# Patient Record
Sex: Male | Born: 1960 | Race: White | Hispanic: No | Smoking: Current every day smoker
Health system: Southern US, Community
[De-identification: ages and names within clinical notes are randomized; demographics above are authoritative.]

## PROBLEM LIST (undated history)

## (undated) DIAGNOSIS — Z720 Tobacco use: Secondary | ICD-10-CM

## (undated) DIAGNOSIS — E785 Hyperlipidemia, unspecified: Secondary | ICD-10-CM

## (undated) DIAGNOSIS — R972 Elevated prostate specific antigen [PSA]: Secondary | ICD-10-CM

## (undated) DIAGNOSIS — I10 Essential (primary) hypertension: Secondary | ICD-10-CM

## (undated) HISTORY — PX: MANDIBLE FRACTURE SURGERY: SHX706

## (undated) HISTORY — DX: Elevated prostate specific antigen (PSA): R97.20

## (undated) HISTORY — DX: Hyperlipidemia, unspecified: E78.5

## (undated) HISTORY — DX: Essential (primary) hypertension: I10

## (undated) HISTORY — DX: Tobacco use: Z72.0

---

## 2013-01-21 ENCOUNTER — Telehealth: Payer: Self-pay

## 2013-01-21 NOTE — Telephone Encounter (Signed)
Pt referred by Dr. Sherwood Gambler for screening colonscopy. LM to call.

## 2013-01-27 NOTE — Telephone Encounter (Signed)
LM for pt to call

## 2013-01-29 NOTE — Telephone Encounter (Signed)
Letter to pt and PCP.  

## 2013-02-23 ENCOUNTER — Encounter (INDEPENDENT_AMBULATORY_CARE_PROVIDER_SITE_OTHER): Payer: Self-pay | Admitting: *Deleted

## 2015-03-15 ENCOUNTER — Other Ambulatory Visit (HOSPITAL_COMMUNITY): Payer: Self-pay | Admitting: Family Medicine

## 2015-03-15 ENCOUNTER — Ambulatory Visit (HOSPITAL_COMMUNITY)
Admission: RE | Admit: 2015-03-15 | Discharge: 2015-03-15 | Disposition: A | Discharge status: Home / Self Care | Payer: 59 | Source: Ambulatory Visit | Attending: Family Medicine | Admitting: Family Medicine

## 2015-03-15 DIAGNOSIS — M546 Pain in thoracic spine: Secondary | ICD-10-CM

## 2015-03-15 DIAGNOSIS — J984 Other disorders of lung: Secondary | ICD-10-CM

## 2015-03-15 DIAGNOSIS — Z720 Tobacco use: Secondary | ICD-10-CM | POA: Diagnosis not present

## 2015-03-15 MED ORDER — IOHEXOL 300 MG/ML  SOLN
80.0000 mL | Freq: Once | INTRAMUSCULAR | Status: AC | PRN
  Administered 2015-03-15: 80 mL via INTRAVENOUS

## 2016-02-15 ENCOUNTER — Encounter (INDEPENDENT_AMBULATORY_CARE_PROVIDER_SITE_OTHER): Payer: Self-pay | Admitting: *Deleted

## 2017-02-19 DIAGNOSIS — Z6821 Body mass index (BMI) 21.0-21.9, adult: Secondary | ICD-10-CM | POA: Diagnosis not present

## 2017-02-19 DIAGNOSIS — M25562 Pain in left knee: Secondary | ICD-10-CM | POA: Diagnosis not present

## 2017-02-19 DIAGNOSIS — M1991 Primary osteoarthritis, unspecified site: Secondary | ICD-10-CM | POA: Diagnosis not present

## 2017-02-26 ENCOUNTER — Encounter (INDEPENDENT_AMBULATORY_CARE_PROVIDER_SITE_OTHER): Payer: Self-pay | Admitting: *Deleted

## 2017-05-08 DIAGNOSIS — Z1389 Encounter for screening for other disorder: Secondary | ICD-10-CM | POA: Diagnosis not present

## 2017-05-08 DIAGNOSIS — R42 Dizziness and giddiness: Secondary | ICD-10-CM | POA: Diagnosis not present

## 2017-05-08 DIAGNOSIS — M1991 Primary osteoarthritis, unspecified site: Secondary | ICD-10-CM | POA: Diagnosis not present

## 2017-05-08 DIAGNOSIS — Z6820 Body mass index (BMI) 20.0-20.9, adult: Secondary | ICD-10-CM | POA: Diagnosis not present

## 2017-05-21 DIAGNOSIS — Z1211 Encounter for screening for malignant neoplasm of colon: Secondary | ICD-10-CM | POA: Diagnosis not present

## 2017-07-26 DIAGNOSIS — M1991 Primary osteoarthritis, unspecified site: Secondary | ICD-10-CM | POA: Diagnosis not present

## 2017-07-26 DIAGNOSIS — G894 Chronic pain syndrome: Secondary | ICD-10-CM | POA: Diagnosis not present

## 2017-07-26 DIAGNOSIS — Z6821 Body mass index (BMI) 21.0-21.9, adult: Secondary | ICD-10-CM | POA: Diagnosis not present

## 2019-01-29 DIAGNOSIS — Z23 Encounter for immunization: Secondary | ICD-10-CM | POA: Diagnosis not present

## 2019-10-08 ENCOUNTER — Other Ambulatory Visit: Payer: Self-pay

## 2019-10-08 DIAGNOSIS — Z20822 Contact with and (suspected) exposure to covid-19: Secondary | ICD-10-CM

## 2019-10-09 LAB — NOVEL CORONAVIRUS, NAA: SARS-CoV-2, NAA: NOT DETECTED

## 2021-02-23 ENCOUNTER — Telehealth: Payer: Self-pay

## 2021-02-23 NOTE — Telephone Encounter (Signed)
Mr Spadafore called inquiring about [facility] and [facility]. Discussed documents needed to enroll with [facility]. Appointment to apply in office for 02/28/21 at 1PM Directions given and documents reviewed to bring.   Francee Nodal RN [employer]

## 2021-03-14 ENCOUNTER — Telehealth: Payer: Self-pay

## 2021-03-14 NOTE — Telephone Encounter (Signed)
Attempted to return call unable to leave voicemail. Client wanting to reschedule an appointment to enroll in Care [facility].   Francee Nodal RN [employer]

## 2021-03-27 NOTE — Congregational Nurse Program (Signed)
Pt attended [facility]  to be connected with the [facility] program to get established with a primary care provider  Pt states he last seen a PCP Feb 2021 at [facility].  States her chief complaint(s) Pt states he stepped in an uneven area in his yard about 3 months ago and since then noticed right knee pain.  States he has noticed cracking sounds in his knees.  To help with pain has been utilizing knee braces, pain patches, and OTC pain medicines and creams.   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  Also in mid-90"s surgery related to broken rib and punctured lung.  Pt does admit to being a current smoker and smoking less than 1/2 pk of cigarettes a a day, a denies current drug and alcohol use.  One (1) Socio-determinants health needs identified: -need for more food access (resource information for food mobile market on 4.29.22    Appointment for first medical visit was scheduled for Tues, Apr 04 2021 @ 10:15 am at the [facility] [place].    Plan -  Enrollment and eligibility was completed on today's for the [facility] Connect Program by Abran Duke.    -Referred to Nurse Case Manager, Norval Gable, for initial and continous medical case management upon completion of first medical appointment at [facility]  -Refer to [facility] Questionairre for other referrals made

## 2021-03-28 ENCOUNTER — Ambulatory Visit: Payer: Self-pay | Admitting: Physician Assistant

## 2021-04-04 ENCOUNTER — Other Ambulatory Visit: Payer: Self-pay

## 2021-04-04 ENCOUNTER — Telehealth: Payer: Self-pay

## 2021-04-04 ENCOUNTER — Other Ambulatory Visit (HOSPITAL_COMMUNITY)
Admission: RE | Admit: 2021-04-04 | Discharge: 2021-04-04 | Disposition: A | Discharge status: Home / Self Care | Payer: Self-pay | Source: Ambulatory Visit | Attending: Physician Assistant | Admitting: Physician Assistant

## 2021-04-04 ENCOUNTER — Other Ambulatory Visit: Payer: Self-pay | Admitting: Physician Assistant

## 2021-04-04 ENCOUNTER — Encounter: Payer: Self-pay | Admitting: Physician Assistant

## 2021-04-04 ENCOUNTER — Ambulatory Visit: Payer: Self-pay | Admitting: Physician Assistant

## 2021-04-04 ENCOUNTER — Ambulatory Visit (HOSPITAL_COMMUNITY)
Admission: RE | Admit: 2021-04-04 | Discharge: 2021-04-04 | Disposition: A | Discharge status: Home / Self Care | Payer: Self-pay | Source: Ambulatory Visit | Attending: Physician Assistant | Admitting: Physician Assistant

## 2021-04-04 VITALS — BP 145/97 | HR 96 | Temp 97.2°F | Ht 71.5 in | Wt 167.0 lb

## 2021-04-04 DIAGNOSIS — Z125 Encounter for screening for malignant neoplasm of prostate: Secondary | ICD-10-CM | POA: Insufficient documentation

## 2021-04-04 DIAGNOSIS — Z13220 Encounter for screening for lipoid disorders: Secondary | ICD-10-CM | POA: Insufficient documentation

## 2021-04-04 DIAGNOSIS — Z7689 Persons encountering health services in other specified circumstances: Secondary | ICD-10-CM

## 2021-04-04 DIAGNOSIS — M25561 Pain in right knee: Secondary | ICD-10-CM | POA: Insufficient documentation

## 2021-04-04 DIAGNOSIS — F17200 Nicotine dependence, unspecified, uncomplicated: Secondary | ICD-10-CM

## 2021-04-04 DIAGNOSIS — Z1211 Encounter for screening for malignant neoplasm of colon: Secondary | ICD-10-CM

## 2021-04-04 DIAGNOSIS — R030 Elevated blood-pressure reading, without diagnosis of hypertension: Secondary | ICD-10-CM

## 2021-04-04 DIAGNOSIS — M25461 Effusion, right knee: Secondary | ICD-10-CM

## 2021-04-04 LAB — COMPREHENSIVE METABOLIC PANEL
ALT: 12 U/L (ref 0–44)
AST: 19 U/L (ref 15–41)
Albumin: 4.1 g/dL (ref 3.5–5.0)
Alkaline Phosphatase: 70 U/L (ref 38–126)
Anion gap: 9 (ref 5–15)
BUN: 10 mg/dL (ref 8–23)
CO2: 21 mmol/L — ABNORMAL LOW (ref 22–32)
Calcium: 9.2 mg/dL (ref 8.9–10.3)
Chloride: 104 mmol/L (ref 98–111)
Creatinine, Ser: 0.75 mg/dL (ref 0.61–1.24)
GFR, Estimated: >60 mL/min (ref >60)
Glucose, Bld: 103 mg/dL — ABNORMAL HIGH (ref 70–99)
Potassium: 4.1 mmol/L (ref 3.5–5.1)
Sodium: 134 mmol/L — ABNORMAL LOW (ref 135–145)
Total Bilirubin: 0.7 mg/dL (ref 0.3–1.2)
Total Protein: 8.4 g/dL — ABNORMAL HIGH (ref 6.5–8.1)

## 2021-04-04 LAB — LIPID PANEL
Cholesterol: 178 mg/dL (ref 0–200)
HDL: 29 mg/dL — ABNORMAL LOW (ref >40)
LDL Cholesterol: 121 mg/dL — ABNORMAL HIGH (ref 0–99)
Total CHOL/HDL Ratio: 6.1 RATIO
Triglycerides: 142 mg/dL (ref <150)
VLDL: 28 mg/dL (ref 0–40)

## 2021-04-04 LAB — PSA: Prostatic Specific Antigen: 4.55 ng/mL — ABNORMAL HIGH (ref 0.00–4.00)

## 2021-04-04 NOTE — Telephone Encounter (Signed)
Called to follow up with client after his first [facility] appointment. He states everything went very well and he has gone to get his labs drawn and x-rays. He states he is being referred to orthopedist and will need CAFA assistance. Emailed D. Leavy Cella of [facility] for financial assistance application assistance.  No further needs at this time.  Francee Nodal RN  [employer]

## 2021-04-04 NOTE — Progress Notes (Signed)
BP (!) 145/97   Pulse 96   Temp (!) 97.2 F (36.2 C)   Ht 5' 11.5" (1.816 m)   Wt 167 lb (75.8 kg)   SpO2 99%   BMI 22.97 kg/m    Subjective:    Patient ID: Rodney Hampton, male    DOB: 05-Aug-1961, 60 y.o.   MRN: [id number]  HPI: Rodney Hampton is a 60 y.o. male presenting on 04/04/2021 for New Patient (Initial Visit)   HPI   Pt had a negative covid 19 screening questionnaire.    Pt is 60yoM who presents to establish care  Pt previously went to [facility].  Pt has not worked in about a year.  He previously worked in Scient[employer], water quality.  He has Never had colonoscopy; he "did the cards"  Pt has has some problems with his right knee in the past.  He was told he had arthritis.  About a month ago, he stepped in a hole and his knee has been much more painful.  Pt has not gotten the covid vaccination.     Relevant past medical, surgical, family and social history reviewed and updated as indicated. Interim medical history since our last visit reviewed. Allergies and medications reviewed and updated.  No current outpatient medications on file.     Review of Systems  Per HPI unless specifically indicated above     Objective:    BP (!) 145/97   Pulse 96   Temp (!) 97.2 F (36.2 C)   Ht 5' 11.5" (1.816 m)   Wt 167 lb (75.8 kg)   SpO2 99%   BMI 22.97 kg/m   Wt Readings from Last 3 Encounters:  04/04/21 167 lb (75.8 kg)    Physical Exam Constitutional:      General: He is not in acute distress.    Appearance: He is not toxic-appearing.     Comments: disheveled  HENT:     Head: Normocephalic and atraumatic.     Right Ear: Tympanic membrane and ear canal normal.     Left Ear: Tympanic membrane and ear canal normal.  Eyes:     Extraocular Movements: Extraocular movements intact.     Conjunctiva/sclera: Conjunctivae normal.     Pupils: Pupils are equal, round, and reactive to light.  Cardiovascular:     Rate and Rhythm: Normal rate and regular rhythm.      Pulses: Normal pulses.     Heart sounds: Normal heart sounds.  Pulmonary:     Effort: Pulmonary effort is normal. No respiratory distress.     Breath sounds: Normal breath sounds. No wheezing.  Abdominal:     General: Bowel sounds are normal.     Palpations: Abdomen is soft. There is no mass or pulsatile mass.     Tenderness: There is no abdominal tenderness.  Musculoskeletal:     Cervical back: Neck supple.     Right knee: Swelling and crepitus present. No erythema or ecchymosis. Decreased range of motion. Tenderness present.     Right lower leg: No edema.     Left lower leg: No edema.  Lymphadenopathy:     Cervical: No cervical adenopathy.  Skin:    General: Skin is warm and dry.     Comments: Fingernails long and yellowed from smoking  Neurological:     Mental Status: He is alert and oriented to person, place, and time.     Motor: No tremor.     Gait: Gait abnormal.  Psychiatric:  Attention and Perception: Attention normal.        Speech: Speech normal.        Behavior: Behavior normal. Behavior is cooperative.              Assessment & Plan:    Encounter Diagnoses  Name Primary?  . Encounter to establish care Yes  . Acute pain of right knee   . Pain and swelling of right knee   . Elevated blood pressure reading   . Screening for prostate cancer   . Screening cholesterol level   . Screening for colon cancer   . Tobacco use disorder       -will get Baseline labs -will get Xray knee -pt is encouraged to get covid vaccination -pt is given cafa / application for [facility] [employer] financial assistance -pt is given FIT test for colon cancer screening  -will Refer to orthopedics for the knee -will recheck bp at next appointment.  He has no history of htn -pt to follow up 1 month.  Will review labs, recheck bp and discuss knee.  He is to contact office sooner prn

## 2021-04-13 ENCOUNTER — Telehealth: Payer: Self-pay

## 2021-04-13 NOTE — Telephone Encounter (Signed)
Client called to follow up with assistance with CAFA application. He has had labs drawn and an x-ray at [facility]. Emailed D. [facility] program manager to follow up with financial assistance application. Client's provider per Epic notes will refer client to orthopedics and client confirms.  Food: reminded client of the [employer]. He forgot the most recent. Will attempt to send a reminder text next month the day prior to the market. Client reports he currently has food.  Encouraged client to contact [facility] Connect for any resources or needs. Client reports understanding.  Francee Nodal RN [employer]

## 2021-05-09 ENCOUNTER — Ambulatory Visit: Payer: Self-pay | Admitting: Physician Assistant

## 2021-05-09 ENCOUNTER — Encounter: Payer: Self-pay | Admitting: Physician Assistant

## 2021-05-09 VITALS — BP 142/94 | HR 96 | Temp 95.0°F

## 2021-05-09 DIAGNOSIS — F17200 Nicotine dependence, unspecified, uncomplicated: Secondary | ICD-10-CM

## 2021-05-09 DIAGNOSIS — E785 Hyperlipidemia, unspecified: Secondary | ICD-10-CM

## 2021-05-09 DIAGNOSIS — I10 Essential (primary) hypertension: Secondary | ICD-10-CM

## 2021-05-09 DIAGNOSIS — R972 Elevated prostate specific antigen [PSA]: Secondary | ICD-10-CM

## 2021-05-09 DIAGNOSIS — M25561 Pain in right knee: Secondary | ICD-10-CM

## 2021-05-09 DIAGNOSIS — M1711 Unilateral primary osteoarthritis, right knee: Secondary | ICD-10-CM

## 2021-05-09 LAB — IFOBT (OCCULT BLOOD): IFOBT: NEGATIVE

## 2021-05-09 MED ORDER — LISINOPRIL 10 MG PO TABS: 10.0000 mg | ORAL_TABLET | Freq: Every day | ORAL | 3 refills | Status: DC

## 2021-05-09 NOTE — Patient Instructions (Signed)
High Cholesterol  High cholesterol is a condition in which the blood has high levels of a white, waxy substance similar to fat (cholesterol). The liver makes all the cholesterol that the body needs. The human body needs small amounts of cholesterol to help build cells. A person gets extra or excess cholesterol from the food that he or she eats. The blood carries cholesterol from the liver to the rest of the body. If you have high cholesterol, deposits (plaques) may build up on the walls of your arteries. Arteries are the blood vessels that carry blood away from your heart. These plaques make the arteries narrow and stiff. Cholesterol plaques increase your risk for heart attack and stroke. Work with your health care provider to keep your cholesterol levels in a healthy range. What increases the risk? The following factors may make you more likely to develop this condition:  Eating foods that are high in animal fat (saturated fat) or cholesterol.  Being overweight.  Not getting enough exercise.  A family history of high cholesterol (familial hypercholesterolemia).  Use of tobacco products.  Having diabetes. What are the signs or symptoms? There are no symptoms of this condition. How is this diagnosed? This condition may be diagnosed based on the results of a blood test.  If you are older than 62 years of age, your health care provider may check your cholesterol levels every 4-6 years.  You may be checked more often if you have high cholesterol or other risk factors for heart disease. The blood test for cholesterol measures:  "Bad" cholesterol, or LDL cholesterol. This is the main type of cholesterol that causes heart disease. The desired level is less than 100 mg/dL.  "Good" cholesterol, or HDL cholesterol. HDL helps protect against heart disease by cleaning the arteries and carrying the LDL to the liver for processing. The desired level for HDL is 60 mg/dL or higher.  Triglycerides.  These are fats that your body can store or burn for energy. The desired level is less than 150 mg/dL.  Total cholesterol. This measures the total amount of cholesterol in your blood and includes LDL, HDL, and triglycerides. The desired level is less than 200 mg/dL. How is this treated? This condition may be treated with:  Diet changes. You may be asked to eat foods that have more fiber and less saturated fats or added sugar.  Lifestyle changes. These may include regular exercise, maintaining a healthy weight, and quitting use of tobacco products.  Medicines. These are given when diet and lifestyle changes have not worked. You may be prescribed a statin medicine to help lower your cholesterol levels. Follow these instructions at home: Eating and drinking  Eat a healthy, balanced diet. This diet includes: ? Daily servings of a variety of fresh, frozen, or canned fruits and vegetables. ? Daily servings of whole grain foods that are rich in fiber. ? Foods that are low in saturated fats and trans fats. These include poultry and fish without skin, lean cuts of meat, and low-fat dairy products. ? A variety of fish, especially oily fish that contain omega-3 fatty acids. Aim to eat fish at least 2 times a week.  Avoid foods and drinks that have added sugar.  Use healthy cooking methods, such as roasting, grilling, broiling, baking, poaching, steaming, and stir-frying. Do not fry your food except for stir-frying.   Lifestyle  Get regular exercise. Aim to exercise for a total of 150 minutes a week. Increase your activity level by doing activities   such as gardening, walking, and taking the stairs.  Do not use any products that contain nicotine or tobacco, such as cigarettes, e-cigarettes, and chewing tobacco. If you need help quitting, ask your health care provider.   General instructions  Take over-the-counter and prescription medicines only as told by your health care provider.  Keep all  follow-up visits as told by your health care provider. This is important. Where to find more information  American Heart Association: www.heart.org  National Heart, Lung, and Blood Institute: www.nhlbi.nih.gov Contact a health care provider if:  You have trouble achieving or maintaining a healthy diet or weight.  You are starting an exercise program.  You are unable to stop smoking. Get help right away if:  You have chest pain.  You have trouble breathing.  You have any symptoms of a stroke. "BE FAST" is an easy way to remember the main warning signs of a stroke: ? B - Balance. Signs are dizziness, sudden trouble walking, or loss of balance. ? E - Eyes. Signs are trouble seeing or a sudden change in vision. ? F - Face. Signs are sudden weakness or numbness of the face, or the face or eyelid drooping on one side. ? A - Arms. Signs are weakness or numbness in an arm. This happens suddenly and usually on one side of the body. ? S - Speech. Signs are sudden trouble speaking, slurred speech, or trouble understanding what people say. ? T - Time. Time to call emergency services. Write down what time symptoms started.  You have other signs of a stroke, such as: ? A sudden, severe headache with no known cause. ? Nausea or vomiting. ? Seizure. These symptoms may represent a serious problem that is an emergency. Do not wait to see if the symptoms will go away. Get medical help right away. Call your local emergency services (911 in the [place] Do not drive yourself to the hospital. Summary  Cholesterol plaques increase your risk for heart attack and stroke. Work with your health care provider to keep your cholesterol levels in a healthy range.  Eat a healthy, balanced diet, get regular exercise, and maintain a healthy weight.  Do not use any products that contain nicotine or tobacco, such as cigarettes, e-cigarettes, and chewing tobacco.  Get help right away if you have any symptoms of a  stroke. This information is not intended to replace advice given to you by your health care provider. Make sure you discuss any questions you have with your health care provider. Document Revised: 10/19/2019 Document Reviewed: 10/19/2019 Elsevier Patient Education  2021 Elsevier Inc.  

## 2021-05-09 NOTE — Progress Notes (Signed)
BP (!) 142/94   Pulse 96   Temp (!) 95 F (35 C)   SpO2 97%    Subjective:    Patient ID: Rodney Hampton, male    DOB: 1961-04-17, 60 y.o.   MRN: [id number]  HPI: Rodney Hampton is a 60 y.o. male presenting on 05/09/2021 for Hypertension   HPI   Pt had a negative covid 19 screening questionnaire.   Chief Complaint  Patient presents with  . Hypertension     Relevant past medical, surgical, family and social history reviewed and updated as indicated. Interim medical history since our last visit reviewed. Allergies and medications reviewed and updated.  CURRENT MEDS: OTC ibuprofen prn   Review of Systems  Per HPI unless specifically indicated above     Objective:    BP (!) 142/94   Pulse 96   Temp (!) 95 F (35 C)   SpO2 97%   Wt Readings from Last 3 Encounters:  04/04/21 167 lb (75.8 kg)    Physical Exam Vitals reviewed.  Constitutional:      General: He is not in acute distress.    Appearance: He is well-developed. He is not toxic-appearing.     Comments: disheveled  HENT:     Head: Normocephalic and atraumatic.  Cardiovascular:     Rate and Rhythm: Normal rate and regular rhythm.  Pulmonary:     Effort: Pulmonary effort is normal.     Breath sounds: Normal breath sounds. No wheezing.  Abdominal:     General: Bowel sounds are normal.     Palpations: Abdomen is soft.     Tenderness: There is no abdominal tenderness.  Musculoskeletal:     Cervical back: Neck supple.     Right knee: Swelling and crepitus present. Decreased range of motion. Tenderness present.  Lymphadenopathy:     Cervical: No cervical adenopathy.  Skin:    General: Skin is warm and dry.  Neurological:     Mental Status: He is alert and oriented to person, place, and time.     Gait: Gait abnormal.  Psychiatric:        Attention and Perception: Attention normal.        Behavior: Behavior normal. Behavior is cooperative.     Comments: Very pleasant.  Engages easily.       Results for orders placed or performed during the hospital encounter of 04/04/21  Lipid panel  Result Value Ref Range   Cholesterol 178 0 - 200 mg/dL   Triglycerides 621 <308 mg/dL   HDL 29 (L) >65 mg/dL   Total CHOL/HDL Ratio 6.1 RATIO   VLDL 28 0 - 40 mg/dL   LDL Cholesterol 784 (H) 0 - 99 mg/dL  Comprehensive metabolic panel  Result Value Ref Range   Sodium 134 (L) 135 - 145 mmol/L   Potassium 4.1 3.5 - 5.1 mmol/L   Chloride 104 98 - 111 mmol/L   CO2 21 (L) 22 - 32 mmol/L   Glucose, Bld 103 (H) 70 - 99 mg/dL   BUN 10 8 - 23 mg/dL   Creatinine, Ser 6.96 0.61 - 1.24 mg/dL   Calcium 9.2 8.9 - 29.5 mg/dL   Total Protein 8.4 (H) 6.5 - 8.1 g/dL   Albumin 4.1 3.5 - 5.0 g/dL   AST 19 15 - 41 U/L   ALT 12 0 - 44 U/L   Alkaline Phosphatase 70 38 - 126 U/L   Total Bilirubin 0.7 0.3 - 1.2 mg/dL   GFR, Estimated >28 >  60 mL/min   Anion gap 9 5 - 15  PSA  Result Value Ref Range   Prostatic Specific Antigen 4.55 (H) 0.00 - 4.00 ng/mL      Assessment & Plan:    Encounter Diagnoses  Name Primary?  . Primary hypertension Yes  . Hyperlipidemia, unspecified hyperlipidemia type   . Elevated PSA   . Acute pain of right knee   . Osteoarthritis of right knee, unspecified osteoarthritis type   . Tobacco use disorder      -reviewed labs with pt including FIT test which was normal and knee xray -pt was given cafa/application for [employer] financial assistance.  He thinks he applied.  Will check with [facility] to see if pt is approved. -Refer to orthopedics for R knee -will start lisinopril for htn -counseled on cholesterol.  Discussed lowfat diet and regular exercise.  Discussed that if it is still high in 3 months at recheck will add statin -Refer to urologist for elevated psa -pt was Encouraged to get covid vaccination -encouraged smoking cessation -pt to follow up 3 months.  He is to contact office sooner prn

## 2021-05-10 ENCOUNTER — Encounter: Payer: Self-pay | Admitting: Orthopaedic Surgery

## 2021-05-23 ENCOUNTER — Ambulatory Visit (INDEPENDENT_AMBULATORY_CARE_PROVIDER_SITE_OTHER): Payer: Self-pay | Admitting: Orthopaedic Surgery

## 2021-05-23 ENCOUNTER — Telehealth: Payer: Self-pay

## 2021-05-23 ENCOUNTER — Other Ambulatory Visit: Payer: Self-pay

## 2021-05-23 ENCOUNTER — Encounter: Payer: Self-pay | Admitting: Orthopaedic Surgery

## 2021-05-23 VITALS — BP 155/98 | HR 101 | Ht 73.0 in | Wt 170.0 lb

## 2021-05-23 DIAGNOSIS — M25461 Effusion, right knee: Secondary | ICD-10-CM

## 2021-05-23 DIAGNOSIS — M1711 Unilateral primary osteoarthritis, right knee: Secondary | ICD-10-CM

## 2021-05-23 DIAGNOSIS — G8929 Other chronic pain: Secondary | ICD-10-CM

## 2021-05-23 NOTE — Telephone Encounter (Signed)
Client called after leaving orthopedist inquiring about his CAFA application which was submitted for review on 05/18/21 by Fay Records of [facility]. Client reports specialist would like to order MRI for client however needs to know CAFA approval. Will plan to follow up with D. Leavy Cella when she is available. Client reports understanding.  Francee Nodal RN [facility] [employer]/[facility]

## 2021-05-23 NOTE — Progress Notes (Signed)
Subjective:    Patient ID: Rodney Hampton, male    DOB: 30-Aug-1961, 60 y.o.   MRN: [id number]  HPI He has a long five to six year history of pain of the right knee getting worse over the last several months.  He has large effusion, popping and giving way.  He has been seen by his primary care doctor and referred here.  Prior X-rays done showed large effusion and bone on bone.    I have independently reviewed and interpreted x-rays of this patient done at another site by another physician or qualified health professional.  He is not responding to NSAIDs, rubs, ice or heat.  He has no trauma.  He has no redness.  He is not getting any better.   Review of Systems  Constitutional:  Positive for activity change.  Musculoskeletal:  Positive for arthralgias, gait problem, joint swelling and myalgias.  All other systems reviewed and are negative. For Review of Systems, all other systems reviewed and are negative.  The following is a summary of the past history medically, past history surgically, known current medicines, social history and family history.  This information is gathered electronically by the computer from prior information and documentation.  I review this each visit and have found including this information at this point in the chart is beneficial and informative.   History reviewed. No pertinent past medical history.  Past Surgical History:  Procedure Laterality Date   MANDIBLE FRACTURE SURGERY      Current Outpatient Medications on File Prior to Visit  Medication Sig Dispense Refill   ibuprofen (ADVIL) 200 MG tablet Take 200 mg by mouth every 6 (six) hours as needed.     lisinopril (ZESTRIL) 10 MG tablet Take 1 tablet (10 mg total) by mouth daily. 30 tablet 3   No current facility-administered medications on file prior to visit.    Social History   Socioeconomic History   Marital status: Widowed    Spouse name: Not on file   Number of children: Not on file   Years  of education: Not on file   Highest education level: Not on file  Occupational History   Not on file  Tobacco Use   Smoking status: Every Day    Packs/day: 0.50    Pack years: 0.00    Types: Cigarettes   Smokeless tobacco: Never  Vaping Use   Vaping Use: Never used  Substance and Sexual Activity   Alcohol use: Not Currently    Comment: hx heavy drinking.  none since 2000   Drug use: Not Currently    Types: Marijuana    Comment: none since 60yo   Sexual activity: Not on file  Other Topics Concern   Not on file  Social History Narrative   Not on file   Social Determinants of Health   Financial Resource Strain: Not on file  Food Insecurity: Not on file  Transportation Needs: Not on file  Physical Activity: Not on file  Stress: Not on file  Social Connections: Not on file  Intimate Partner Violence: Not on file    Family History  Problem Relation Age of Onset   Cancer Mother    Leukemia Mother     BP (!) 155/98   Pulse (!) 101   Ht 6\' 1"  (1.854 m)   Wt 170 lb (77.1 kg)   BMI 22.43 kg/m   Body mass index is 22.43 kg/m.     Objective:   Physical Exam Vitals and  nursing note reviewed. Exam conducted with a chaperone present.  Constitutional:      Appearance: He is well-developed.  HENT:     Head: Normocephalic and atraumatic.  Eyes:     Conjunctiva/sclera: Conjunctivae normal.     Pupils: Pupils are equal, round, and reactive to light.  Cardiovascular:     Rate and Rhythm: Normal rate and regular rhythm.  Pulmonary:     Effort: Pulmonary effort is normal.  Abdominal:     Palpations: Abdomen is soft.  Musculoskeletal:     Cervical back: Normal range of motion and neck supple.       Legs:  Skin:    General: Skin is warm and dry.  Neurological:     Mental Status: He is alert and oriented to person, place, and time.     Cranial Nerves: No cranial nerve deficit.     Motor: No abnormal muscle tone.     Coordination: Coordination normal.     Deep  Tendon Reflexes: Reflexes are normal and symmetric. Reflexes normal.  Psychiatric:        Behavior: Behavior normal.        Thought Content: Thought content normal.        Judgment: Judgment normal.          Assessment & Plan:   Encounter Diagnosis  Name Primary?   Chronic pain of right knee Yes   PROCEDURE NOTE:  The patient request injection, verbal consent was obtained.  The right knee was prepped appropriately after time out was performed.   Sterile technique was observed and anesthesia was provided by ethyl chloride and a 20-gauge needle was used to inject the knee area.  A 16-gauge needle was then used to aspirate the knee.  Color of fluid aspirated was straw with white particles in the fluid  Total cc's aspirated was 30 cc.    Injection of 1 cc of DepoMedrol 40 mg with several cc's of plain xylocaine was then performed.  A band aid dressing was applied.  The patient was advised to apply ice later today and tomorrow to the injection sight as needed.   He needs MRI of the knee.  He will apply for [employer].  Return in three weeks.  Call if any problem. Fluid sent for cell count, crystals.  Precautions discussed.  Electronically Signed Darreld Mclean, MD 6/21/20223:58 PM

## 2021-05-24 LAB — SYNOVIAL FLUID ANALYSIS, COMPLETE
Basophils, %: 0 %
Eosinophils-Synovial: 0 % (ref 0–2)
Lymphocytes-Synovial Fld: 10 % (ref 0–74)
Monocyte/Macrophage: 5 % (ref 0–69)
Neutrophil, Synovial: 85 % — ABNORMAL HIGH (ref 0–24)
Synoviocytes, %: 0 % (ref 0–15)
WBC, Synovial: 18760 cells/uL — ABNORMAL HIGH (ref <150)

## 2021-05-30 ENCOUNTER — Telehealth: Payer: Self-pay

## 2021-05-30 NOTE — Telephone Encounter (Signed)
Called to follow up with client. He had called last week inquiring about his [place] financial assistance application that [facility] had assisted him to submit.  Fay Records check progress and application is still awaiting approval and review.  Client notified of above. He will call back in a week or so to check again as his specialist is awaiting approval for MRI of his knee.  No further needs at this time.  Francee Nodal RN [employer]

## 2021-06-12 ENCOUNTER — Telehealth: Payer: Self-pay | Admitting: Orthopaedic Surgery

## 2021-06-12 NOTE — Telephone Encounter (Signed)
Patient called and states he needs to cancel his appt for tomorrow.  He has not had his MRI done yet.    He does not qualify for the [facility] discount, he does qualify for the [facility] discount for MRI.   Please call him back at [phone]

## 2021-06-13 ENCOUNTER — Ambulatory Visit: Payer: Self-pay | Admitting: Orthopaedic Surgery

## 2021-06-30 ENCOUNTER — Ambulatory Visit: Payer: Self-pay | Admitting: Urology

## 2021-07-20 ENCOUNTER — Telehealth: Payer: Self-pay | Admitting: Orthopaedic Surgery

## 2021-07-20 DIAGNOSIS — G8929 Other chronic pain: Secondary | ICD-10-CM

## 2021-07-20 NOTE — Telephone Encounter (Signed)
Rodney Hampton called and left voicemail asking if we have sent order for him to have MRI to [facility]?  Apparently he has financial assistance at [facility].   Please see previous notes from [facility] and Dr. Hilda Lias  Can you help me with this?  Thanks

## 2021-08-09 ENCOUNTER — Other Ambulatory Visit: Payer: Self-pay

## 2021-08-09 ENCOUNTER — Encounter: Payer: Self-pay | Admitting: Physician Assistant

## 2021-08-09 ENCOUNTER — Ambulatory Visit: Payer: Self-pay | Admitting: Physician Assistant

## 2021-08-09 VITALS — BP 131/82 | HR 78 | Temp 97.7°F | Wt 172.5 lb

## 2021-08-09 DIAGNOSIS — F17200 Nicotine dependence, unspecified, uncomplicated: Secondary | ICD-10-CM

## 2021-08-09 DIAGNOSIS — I10 Essential (primary) hypertension: Secondary | ICD-10-CM

## 2021-08-09 DIAGNOSIS — R972 Elevated prostate specific antigen [PSA]: Secondary | ICD-10-CM

## 2021-08-09 DIAGNOSIS — E785 Hyperlipidemia, unspecified: Secondary | ICD-10-CM

## 2021-08-09 MED ORDER — LISINOPRIL 10 MG PO TABS: 10.0000 mg | ORAL_TABLET | Freq: Every day | ORAL | 3 refills | Status: DC

## 2021-08-09 NOTE — Progress Notes (Signed)
BP 131/82   Pulse 78   Temp 97.7 F (36.5 C)   Wt 172 lb 8 oz (78.2 kg)   SpO2 98%   BMI 22.76 kg/m    Subjective:    Patient ID: Rodney Hampton, male    DOB: [birth year], 60 y.o.   MRN: [id number]  HPI: Rodney Hampton is a 60 y.o. male presenting on 08/09/2021 for Hypertension and Hyperlipidemia   HPI   Pt had a negative covid 19 screening questionnaire.   Chief Complaint  Patient presents with   Hypertension   Hyperlipidemia     Pt says he is not eligible for cafa/[employer] financial assistance because  he has some money in a 401K  account  He is Not covid vaccinated.  He didn't scheduled with urology for elevated PSA due to no cafa  He did go to orthopedics for his knee.  He has no new complaints today.         Relevant past medical, surgical, family and social history reviewed and updated as indicated. Interim medical history since our last visit reviewed. Allergies and medications reviewed and updated.   Current Outpatient Medications:    aspirin EC 81 MG tablet, Take 81 mg by mouth daily. Swallow whole., Disp: , Rfl:    ibuprofen (ADVIL) 200 MG tablet, Take 200 mg by mouth every 6 (six) hours as needed., Disp: , Rfl:    lisinopril (ZESTRIL) 10 MG tablet, Take 1 tablet (10 mg total) by mouth daily., Disp: 30 tablet, Rfl: 3   Review of Systems  Per HPI unless specifically indicated above     Objective:    BP 131/82   Pulse 78   Temp 97.7 F (36.5 C)   Wt 172 lb 8 oz (78.2 kg)   SpO2 98%   BMI 22.76 kg/m   Wt Readings from Last 3 Encounters:  08/09/21 172 lb 8 oz (78.2 kg)  05/23/21 170 lb (77.1 kg)  04/04/21 167 lb (75.8 kg)    Physical Exam Vitals reviewed.  Constitutional:      General: He is not in acute distress.    Appearance: He is well-developed. He is not ill-appearing.  HENT:     Head: Normocephalic and atraumatic.  Cardiovascular:     Rate and Rhythm: Normal rate and regular rhythm.  Pulmonary:     Effort:  Pulmonary effort is normal.     Breath sounds: Normal breath sounds. No wheezing.  Abdominal:     General: Bowel sounds are normal.     Palpations: Abdomen is soft.     Tenderness: There is no abdominal tenderness.  Musculoskeletal:     Cervical back: Neck supple.     Right lower leg: No edema.     Left lower leg: No edema.  Lymphadenopathy:     Cervical: No cervical adenopathy.  Skin:    General: Skin is warm and dry.  Neurological:     Mental Status: He is alert and oriented to person, place, and time.  Psychiatric:        Behavior: Behavior normal.          Assessment & Plan:    Encounter Diagnoses  Name Primary?   Primary hypertension Yes   Hyperlipidemia, unspecified hyperlipidemia type    Elevated PSA    Tobacco use disorder       -will Check lipids.  Pt will be called with results -pt to Continue licinopril/hctz for HTN -Pt to call to let us know if  he wants to go to urologist here or at [facility] -encouraged Smoking cessation -pt is educated and encouraged to get Covid vaccination -pt to follow up 3 months.  He is to contact office sooner prn

## 2021-08-10 ENCOUNTER — Other Ambulatory Visit: Payer: Self-pay

## 2021-08-10 ENCOUNTER — Other Ambulatory Visit (HOSPITAL_COMMUNITY)
Admission: RE | Admit: 2021-08-10 | Discharge: 2021-08-10 | Disposition: A | Discharge status: Home / Self Care | Payer: Self-pay | Source: Ambulatory Visit | Attending: Physician Assistant | Admitting: Physician Assistant

## 2021-08-10 DIAGNOSIS — E785 Hyperlipidemia, unspecified: Secondary | ICD-10-CM | POA: Insufficient documentation

## 2021-08-10 DIAGNOSIS — I10 Essential (primary) hypertension: Secondary | ICD-10-CM | POA: Insufficient documentation

## 2021-08-10 LAB — COMPREHENSIVE METABOLIC PANEL
ALT: 15 U/L (ref 0–44)
AST: 22 U/L (ref 15–41)
Albumin: 3.8 g/dL (ref 3.5–5.0)
Alkaline Phosphatase: 80 U/L (ref 38–126)
Anion gap: 7 (ref 5–15)
BUN: 8 mg/dL (ref 8–23)
CO2: 26 mmol/L (ref 22–32)
Calcium: 9.1 mg/dL (ref 8.9–10.3)
Chloride: 101 mmol/L (ref 98–111)
Creatinine, Ser: 0.72 mg/dL (ref 0.61–1.24)
GFR, Estimated: >60 mL/min (ref >60)
Glucose, Bld: 95 mg/dL (ref 70–99)
Potassium: 4.2 mmol/L (ref 3.5–5.1)
Sodium: 134 mmol/L — ABNORMAL LOW (ref 135–145)
Total Bilirubin: 0.7 mg/dL (ref 0.3–1.2)
Total Protein: 7.8 g/dL (ref 6.5–8.1)

## 2021-08-10 LAB — LIPID PANEL
Cholesterol: 164 mg/dL (ref 0–200)
HDL: 22 mg/dL — ABNORMAL LOW (ref >40)
LDL Cholesterol: 113 mg/dL — ABNORMAL HIGH (ref 0–99)
Total CHOL/HDL Ratio: 7.5 RATIO
Triglycerides: 144 mg/dL (ref <150)
VLDL: 29 mg/dL (ref 0–40)

## 2021-08-29 ENCOUNTER — Encounter: Payer: Self-pay | Admitting: Orthopaedic Surgery

## 2021-08-29 ENCOUNTER — Other Ambulatory Visit: Payer: Self-pay

## 2021-08-29 ENCOUNTER — Ambulatory Visit (INDEPENDENT_AMBULATORY_CARE_PROVIDER_SITE_OTHER): Payer: Self-pay | Admitting: Orthopaedic Surgery

## 2021-08-29 DIAGNOSIS — S83241A Other tear of medial meniscus, current injury, right knee, initial encounter: Secondary | ICD-10-CM

## 2021-08-29 NOTE — Progress Notes (Signed)
My knee hurts still.  He had MRI at [facility] which showed: 1. Severely macerated body and posterior horn of the medial  meniscus.  2. Diffusely diminutive and torn lateral meniscus.  3. Subchondral insufficiency fractures of the medial and lateral  tibial plateaus.  4. 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    I have explained the findings to him.  He has effusion and medial knee pain and positive medial McMurray, ROM 0 to 105, limp right.  Encounter Diagnosis  Name Primary?   Tear of medial meniscus of right knee, current, initial encounter Yes   He wants to try to arrange finances before he considers arthroscopy.  I told him it was an elective case.  Call if any problem.  Precautions discussed.  Electronically Signed Darreld Mclean, MD 9/27/20223:34 PM

## 2021-08-29 NOTE — Progress Notes (Signed)
[facility] Outside Information   MRI Lower Extremity Joint Right Wo Contrast  Anatomical Region Laterality Modality  Ankle right Magnetic Resonance  Knee -- --  Hip -- --   Impression  1. Severely macerated body and posterior horn of the medial  meniscus.  2. Diffusely diminutive and torn lateral meniscus.  3. Subchondral insufficiency fractures of the medial and lateral  tibial plateaus.  4. 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    Electronically Signed    By: Obie Dredge M.D.    On: 08/28/2021 09:05 Narrative  CLINICAL DATA:  Right knee pain and swelling.   EXAM:  MRI OF THE LOWER RIGHT JOINT WITHOUT CONTRAST   TECHNIQUE:  Multiplanar, multisequence MR imaging of the right knee was  performed. No intravenous contrast was administered.   COMPARISON:  Right knee x-rays dated Apr 04, 2021.   FINDINGS:  MENISCI   Medial meniscus: Severely macerated body and posterior horn with  extrusion.   Lateral meniscus: Diffusely diminutive and torn with extrusion of  the body.   LIGAMENTS   Cruciates: Intact ACL and PCL.   Collaterals: Intact medial collateral ligament and lateral  collateral ligament complex.   CARTILAGE   Patellofemoral: Extensive full-thickness cartilage loss over the  patella trochlea.   Medial: Extensive full-thickness cartilage loss over the medial  femoral condyle and medial tibial plateau.   Lateral: Extensive full-thickness cartilage loss over the lateral  femoral condyle lateral tibial plateau.   MISCELLANEOUS   Joint:  Large joint effusion severe synovitis.  Normal Hoffa's fat.   Popliteal Fossa: No Baker cyst. Intact popliteus tendon.   Extensor Mechanism: Intact quadriceps tendon and patellar tendon.  Intact medial and lateral patellar retinaculum. Intact MPFL.   Bones: Scattered small erosions. Subchondral insufficiency fractures  of the medial and lateral tibial  plateaus (series 4, images 9 and  22). No dislocation. No suspicious bone lesion.   Other: None. Procedure Note  Rica Records, MD - 08/28/2021  Formatting of this note might be different from the original.  CLINICAL DATA:  Right knee pain and swelling.   EXAM:  MRI OF THE LOWER RIGHT JOINT WITHOUT CONTRAST   TECHNIQUE:  Multiplanar, multisequence MR imaging of the right knee was  performed. No intravenous contrast was administered.   COMPARISON:  Right knee x-rays dated Apr 04, 2021.   FINDINGS:  MENISCI   Medial meniscus: Severely macerated body and posterior horn with  extrusion.   Lateral meniscus: Diffusely diminutive and torn with extrusion of  the body.   LIGAMENTS   Cruciates: Intact ACL and PCL.   Collaterals: Intact medial collateral ligament and lateral  collateral ligament complex.   CARTILAGE   Patellofemoral: Extensive full-thickness cartilage loss over the  patella trochlea.   Medial: Extensive full-thickness cartilage loss over the medial  femoral condyle and medial tibial plateau.   Lateral: Extensive full-thickness cartilage loss over the lateral  femoral condyle lateral tibial plateau.   MISCELLANEOUS   Joint:  Large joint effusion severe synovitis.  Normal Hoffa's fat.   Popliteal Fossa: No Baker cyst. Intact popliteus tendon.   Extensor Mechanism: Intact quadriceps tendon and patellar tendon.  Intact medial and lateral patellar retinaculum. Intact MPFL.   Bones: Scattered small erosions. Subchondral insufficiency fractures  of the medial and lateral tibial plateaus (series 4, images 9 and  22). No dislocation. No suspicious bone lesion.   Other: None.   IMPRESSION:  1. Severely macerated body and posterior horn  of the medial  meniscus.  2. Diffusely diminutive and torn lateral meniscus.  3. Subchondral insufficiency fractures of the medial and lateral  tibial plateaus.  4. 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    Electronically Signed    By: Obie Dredge M.D.    On: 08/28/2021 09:05 Exam End: 08/25/21 13:35   Specimen Collected: 08/28/21 08:58 Last Resulted: 08/28/21 09:05  Received From: [facility]  Result Received: 08/28/21 15:54

## 2021-08-30 ENCOUNTER — Telehealth: Payer: Self-pay | Admitting: Orthopaedic Surgery

## 2021-08-30 NOTE — Telephone Encounter (Signed)
Patient had left a voice message asking about information related to surgery that he talked with Dr Hilda Lias about.

## 2021-08-31 NOTE — Telephone Encounter (Signed)
I called LM for patient to call me back.   

## 2021-09-05 ENCOUNTER — Ambulatory Visit: Payer: Self-pay | Admitting: Orthopaedic Surgery

## 2021-09-07 ENCOUNTER — Telehealth: Payer: Self-pay | Admitting: Orthopedic Surgery

## 2021-09-07 ENCOUNTER — Other Ambulatory Visit: Payer: Self-pay

## 2021-09-07 ENCOUNTER — Ambulatory Visit (INDEPENDENT_AMBULATORY_CARE_PROVIDER_SITE_OTHER): Payer: Self-pay | Admitting: Orthopedic Surgery

## 2021-09-07 ENCOUNTER — Encounter: Payer: Self-pay | Admitting: Orthopedic Surgery

## 2021-09-07 VITALS — BP 150/101 | HR 106 | Ht 73.0 in | Wt 172.0 lb

## 2021-09-07 DIAGNOSIS — M25461 Effusion, right knee: Secondary | ICD-10-CM

## 2021-09-07 NOTE — Progress Notes (Signed)
[facility] Outside Information   MRI Lower Extremity Joint Right Wo Contrast  Anatomical Region Laterality Modality  Ankle right Magnetic Resonance  Knee -- --  Hip -- --   Impression  1. Severely macerated body and posterior horn of the medial  meniscus.  2. Diffusely diminutive and torn lateral meniscus.  3. Subchondral insufficiency fractures of the medial and lateral  tibial plateaus.  4. 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    Electronically Signed    By: Obie Dredge M.D.    On: 08/28/2021 09:05 Narrative  CLINICAL DATA:  Right knee pain and swelling.   EXAM:  MRI OF THE LOWER RIGHT JOINT WITHOUT CONTRAST   TECHNIQUE:  Multiplanar, multisequence MR imaging of the right knee was  performed. No intravenous contrast was administered.   COMPARISON:  Right knee x-rays dated Apr 04, 2021.   FINDINGS:  MENISCI   Medial meniscus: Severely macerated body and posterior horn with  extrusion.   Lateral meniscus: Diffusely diminutive and torn with extrusion of  the body.   LIGAMENTS   Cruciates: Intact ACL and PCL.   Collaterals: Intact medial collateral ligament and lateral  collateral ligament complex.   CARTILAGE   Patellofemoral: Extensive full-thickness cartilage loss over the  patella trochlea.   Medial: Extensive full-thickness cartilage loss over the medial  femoral condyle and medial tibial plateau.   Lateral: Extensive full-thickness cartilage loss over the lateral  femoral condyle lateral tibial plateau.   MISCELLANEOUS   Joint:  Large joint effusion severe synovitis.  Normal Hoffa's fat.   Popliteal Fossa: No Baker cyst. Intact popliteus tendon.   Extensor Mechanism: Intact quadriceps tendon and patellar tendon.  Intact medial and lateral patellar retinaculum. Intact MPFL.   Bones: Scattered small erosions. Subchondral insufficiency fractures  of the medial and lateral tibial  plateaus (series 4, images 9 and  22). No dislocation. No suspicious bone lesion.   Other: None. Procedure Note  Rica Records, MD - 08/28/2021  Formatting of this note might be different from the original.  CLINICAL DATA:  Right knee pain and swelling.   EXAM:  MRI OF THE LOWER RIGHT JOINT WITHOUT CONTRAST   TECHNIQUE:  Multiplanar, multisequence MR imaging of the right knee was  performed. No intravenous contrast was administered.   COMPARISON:  Right knee x-rays dated Apr 04, 2021.   FINDINGS:  MENISCI   Medial meniscus: Severely macerated body and posterior horn with  extrusion.   Lateral meniscus: Diffusely diminutive and torn with extrusion of  the body.   LIGAMENTS   Cruciates: Intact ACL and PCL.   Collaterals: Intact medial collateral ligament and lateral  collateral ligament complex.   CARTILAGE   Patellofemoral: Extensive full-thickness cartilage loss over the  patella trochlea.   Medial: Extensive full-thickness cartilage loss over the medial  femoral condyle and medial tibial plateau.   Lateral: Extensive full-thickness cartilage loss over the lateral  femoral condyle lateral tibial plateau.   MISCELLANEOUS   Joint:  Large joint effusion severe synovitis.  Normal Hoffa's fat.   Popliteal Fossa: No Baker cyst. Intact popliteus tendon.   Extensor Mechanism: Intact quadriceps tendon and patellar tendon.  Intact medial and lateral patellar retinaculum. Intact MPFL.   Bones: Scattered small erosions. Subchondral insufficiency fractures  of the medial and lateral tibial plateaus (series 4, images 9 and  22). No dislocation. No suspicious bone lesion.   Other: None.   IMPRESSION:  1. Severely macerated body and posterior horn  of the medial  meniscus.  2. Diffusely diminutive and torn lateral meniscus.  3. Subchondral insufficiency fractures of the medial and lateral  tibial plateaus.  4. 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    Electronically Signed    By: Obie Dredge M.D.    On: 08/28/2021 09:05 Exam End: 08/25/21 13:35

## 2021-09-07 NOTE — Progress Notes (Signed)
  Chief Complaint  Patient presents with   Knee Pain    Right / surgical consult per Dr Hilda Lias     60 year old male referred to me Dr. Hilda Lias for possible surgery on his right knee.  He tells me he has had pain worsening in his right knee for approximately 1 year.  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  He has a 57% discount from the care connect program  He is MRI and x-ray findings show that he has 3 compartment arthrosis with insufficiency fractures of the medial lateral tibial plateaus with meniscal tears on the medial and lateral meniscus however his primary finding on plain film is complete joint space narrowing on both sides of the joint  He is otherwise healthy he does have some hypertension  His exam shows that he has a small frame no developmental abnormalities no nutritional deficits  He has good pulses normal temperature no edema or tenderness is lymph nodes in the lower extremities are normal  He walks with a limp  His right knee has a large effusion he has 100 degrees of flexion he can fully extend the knee he has tenderness on the medial and lateral joint lines and proximal tibia there is no instability in the knee the muscle strength and tone are normal the skin is intact but the knee feels warm compared to the left sensory exam is intact as well he is oriented to time person and place he is pleasant and normal affect  The MRI films that I saw over from [facility] my interpretation of those films is that he has medial and lateral insufficiency fractures probably from the severe arthritis he also has arthritis in all 3 compartments with torn medial and lateral meniscus consistent with osteoarthritis  I repeated his aspiration and sent his fluid off for analysis based on the warmth of the joint I did not inject  I recommended a playmaker brace for 6  weeks to allow some healing of the insufficiency fractures in preparation for knee replacement I also placed him on a cane to unload the joint  I will follow-up with him in 6 weeks for radiographs and preop for possible total knee on the right  Procedure aspiration right knee  Patient gave consent for aspiration right knee which was confirmed as the surgical site with timeout  The knee was placed in extension the knee was cleaned with alcohol prep with ethyl chloride and then 60 cc of yellow and bloody fluid was aspirated from a superolateral approach

## 2021-09-07 NOTE — Telephone Encounter (Signed)
Patient states he is to come back in 6 weeks for xray.  Amy states he needs to follow up in a week for his results.  I scheduled him for 1 week from today and patient now states that the assistant will call him with the results on Monday..  Please advise

## 2021-09-12 LAB — SYNOVIAL FLUID ANALYSIS, COMPLETE
Basophils, %: 0 %
Eosinophils-Synovial: 0 % (ref 0–2)
Lymphocytes-Synovial Fld: 7 % (ref 0–74)
Monocyte/Macrophage: 9 % (ref 0–69)
Neutrophil, Synovial: 84 % — ABNORMAL HIGH (ref 0–24)
Synoviocytes, %: 0 % (ref 0–15)
WBC, Synovial: 40240 cells/uL — ABNORMAL HIGH (ref <150)

## 2021-09-12 LAB — WOUND CULTURE
MICRO NUMBER:: 12470933
RESULT:: NO GROWTH
SPECIMEN QUALITY:: ADEQUATE

## 2021-09-14 ENCOUNTER — Ambulatory Visit (INDEPENDENT_AMBULATORY_CARE_PROVIDER_SITE_OTHER): Payer: Self-pay | Admitting: Orthopedic Surgery

## 2021-09-14 ENCOUNTER — Encounter: Payer: Self-pay | Admitting: Orthopedic Surgery

## 2021-09-14 ENCOUNTER — Other Ambulatory Visit: Payer: Self-pay

## 2021-09-14 VITALS — Ht 73.0 in | Wt 174.1 lb

## 2021-09-14 DIAGNOSIS — M25461 Effusion, right knee: Secondary | ICD-10-CM

## 2021-09-14 DIAGNOSIS — M1711 Unilateral primary osteoarthritis, right knee: Secondary | ICD-10-CM

## 2021-09-14 NOTE — Progress Notes (Signed)
FOLLOW UP   Encounter Diagnoses  Name Primary?   Effusion, right knee Yes   Primary osteoarthritis of right knee      Chief Complaint  Patient presents with   Knee Pain    R/its about the same, here to go over the labs.    Labs were done on the fluid from the right knee joint effusion  He had labs drawn as he is contemplating total knee but he has some subchondral fractures that were treating with brace for 6 weeks he is 1 week into that  His labs were as follows  Few white cells no growth  Yellow turbid fluid 40,000 white cells 84% neutrophils no crystals  Based on the findings this is an inflammatory arthritis  We talked about him stopping his half to 1 pack/day smoking and will give him the next 5 weeks to do that.  He will continue his brace to let the subchondral fractures calm down in preparation for right total knee

## 2021-10-19 ENCOUNTER — Ambulatory Visit: Payer: Self-pay

## 2021-10-19 ENCOUNTER — Ambulatory Visit (INDEPENDENT_AMBULATORY_CARE_PROVIDER_SITE_OTHER): Payer: Self-pay | Admitting: Orthopedic Surgery

## 2021-10-19 ENCOUNTER — Other Ambulatory Visit: Payer: Self-pay

## 2021-10-19 DIAGNOSIS — M1710 Unilateral primary osteoarthritis, unspecified knee: Secondary | ICD-10-CM

## 2021-10-19 DIAGNOSIS — G8929 Other chronic pain: Secondary | ICD-10-CM

## 2021-10-19 DIAGNOSIS — M25561 Pain in right knee: Secondary | ICD-10-CM

## 2021-10-19 DIAGNOSIS — M25461 Effusion, right knee: Secondary | ICD-10-CM

## 2021-10-19 DIAGNOSIS — M1711 Unilateral primary osteoarthritis, right knee: Secondary | ICD-10-CM

## 2021-10-19 NOTE — Progress Notes (Signed)
Chief Complaint  Patient presents with   Knee Pain    RT knee Follow up xrays   Encounter Diagnoses  Name Primary?   Chronic pain of right knee Yes   Effusion, right knee    Primary localized osteoarthritis of knee      60 year old male with synovitis of the right knee aspiration showed no infection just inflammatory cells I think he is fine to go ahead and proceed with right total knee  However, he wants to wait after Christmas and he wants to see me in January to discuss an exact date  Today he had a right knee effusion his knee was warm to touch there were no signs of infection he had limited flexion extension but he is able to work he wears a brace he takes ibuprofen on a limited basis  Recommend follow-up in January to set up surgery at his convenience

## 2021-11-08 ENCOUNTER — Encounter: Payer: Self-pay | Admitting: Physician Assistant

## 2021-11-08 ENCOUNTER — Ambulatory Visit: Payer: Self-pay | Admitting: Physician Assistant

## 2021-11-08 ENCOUNTER — Other Ambulatory Visit: Payer: Self-pay

## 2021-11-08 VITALS — BP 142/89 | HR 78 | Temp 97.4°F | Wt 174.0 lb

## 2021-11-08 DIAGNOSIS — G8929 Other chronic pain: Secondary | ICD-10-CM

## 2021-11-08 DIAGNOSIS — I10 Essential (primary) hypertension: Secondary | ICD-10-CM

## 2021-11-08 DIAGNOSIS — F17200 Nicotine dependence, unspecified, uncomplicated: Secondary | ICD-10-CM

## 2021-11-08 DIAGNOSIS — R972 Elevated prostate specific antigen [PSA]: Secondary | ICD-10-CM

## 2021-11-08 MED ORDER — LISINOPRIL 20 MG PO TABS
20.0000 mg | ORAL_TABLET | Freq: Every day | ORAL | 1 refills | Status: DC
Start: 2021-11-08 — End: 2022-08-13

## 2021-11-08 NOTE — Progress Notes (Signed)
   BP (!) 142/89   Pulse 78   Temp (!) 97.4 F (36.3 C)   Wt 174 lb (78.9 kg)   SpO2 98%   BMI 22.96 kg/m    Subjective:    Patient ID: Rodney Hampton, male    DOB: [birth year], 60 y.o.   MRN: [id number]  HPI: Rodney Hampton is a 60 y.o. male presenting on 11/08/2021 for Hypertension   HPI    Chief Complaint  Patient presents with   Hypertension      Pt going to orthopedics for his knee.  He Says he was told he needs knee replacement.  He has Cafa 57%  He is taking his bp med every day  Pt was a no-show to his urology appointment    Relevant past medical, surgical, family and social history reviewed and updated as indicated. Interim medical history since our last visit reviewed. Allergies and medications reviewed and updated.   Current Outpatient Medications:    aspirin EC 81 MG tablet, Take 81 mg by mouth daily. Swallow whole., Disp: , Rfl:    lisinopril (ZESTRIL) 10 MG tablet, Take 1 tablet (10 mg total) by mouth daily., Disp: 30 tablet, Rfl: 3   ibuprofen (ADVIL) 200 MG tablet, Take 200 mg by mouth every 6 (six) hours as needed. (Patient not taking: Reported on 11/08/2021), Disp: , Rfl:      Review of Systems  Per HPI unless specifically indicated above     Objective:    BP (!) 142/89   Pulse 78   Temp (!) 97.4 F (36.3 C)   Wt 174 lb (78.9 kg)   SpO2 98%   BMI 22.96 kg/m   Wt Readings from Last 3 Encounters:  11/08/21 174 lb (78.9 kg)  09/14/21 174 lb 2 oz (79 kg)  09/07/21 172 lb (78 kg)    Physical Exam Vitals reviewed.  Constitutional:      General: He is not in acute distress.    Appearance: He is well-developed. He is not ill-appearing.  HENT:     Head: Normocephalic and atraumatic.  Cardiovascular:     Rate and Rhythm: Normal rate and regular rhythm.  Pulmonary:     Effort: Pulmonary effort is normal.     Breath sounds: Normal breath sounds. No wheezing.  Abdominal:     General: Bowel sounds are normal.     Palpations: Abdomen  is soft.     Tenderness: There is no abdominal tenderness.  Musculoskeletal:     Cervical back: Neck supple.     Right lower leg: No edema.     Left lower leg: No edema.  Lymphadenopathy:     Cervical: No cervical adenopathy.  Skin:    General: Skin is warm and dry.  Neurological:     Mental Status: He is alert and oriented to person, place, and time.  Psychiatric:        Behavior: Behavior normal.          Assessment & Plan:    Encounter Diagnoses  Name Primary?   Primary hypertension Yes   Tobacco use disorder    Elevated PSA    Chronic pain of right knee      -Increase lisinopril to 20mg  -Pt to call urologist to reschedule -Counseled and encouraged covid vaccination -encouraged smoking cessation -pt to continue with orthopedics for his knee -pt to follow up 3 months.  He is to contact office sooner prn

## 2021-11-08 NOTE — Patient Instructions (Signed)
Call to reschedule with urologist Phone: [phone]

## 2021-12-21 ENCOUNTER — Ambulatory Visit: Payer: Self-pay | Admitting: Orthopedic Surgery

## 2022-01-22 ENCOUNTER — Other Ambulatory Visit: Payer: Self-pay | Admitting: Physician Assistant

## 2022-01-22 DIAGNOSIS — I10 Essential (primary) hypertension: Secondary | ICD-10-CM

## 2022-01-29 ENCOUNTER — Ambulatory Visit: Payer: Self-pay | Admitting: Orthopedic Surgery

## 2022-02-06 ENCOUNTER — Ambulatory Visit: Payer: Self-pay | Admitting: Physician Assistant

## 2022-02-06 ENCOUNTER — Encounter: Payer: Self-pay | Admitting: Physician Assistant

## 2022-02-06 VITALS — BP 122/70 | HR 83 | Temp 98.3°F

## 2022-02-06 DIAGNOSIS — R972 Elevated prostate specific antigen [PSA]: Secondary | ICD-10-CM

## 2022-02-06 DIAGNOSIS — F17200 Nicotine dependence, unspecified, uncomplicated: Secondary | ICD-10-CM

## 2022-02-06 DIAGNOSIS — G8929 Other chronic pain: Secondary | ICD-10-CM

## 2022-02-06 DIAGNOSIS — I10 Essential (primary) hypertension: Secondary | ICD-10-CM

## 2022-02-06 DIAGNOSIS — L309 Dermatitis, unspecified: Secondary | ICD-10-CM

## 2022-02-06 MED ORDER — CLOTRIMAZOLE-BETAMETHASONE 1-0.05 % EX CREA: 1.0000 "application " | TOPICAL_CREAM | Freq: Two times a day (BID) | CUTANEOUS | 1 refills | Status: DC

## 2022-02-06 NOTE — Progress Notes (Signed)
? ?BP 122/70   Pulse 83   Temp 98.3 ?F (36.8 ?C)   Wt 227 lb (103 kg)   SpO2 98%   BMI 29.95 kg/m?   ? ?Subjective:  ? ? Patient ID: Rodney Hampton, male    DOB: 1961-05-04, 61 y.o.   MRN: [id number] ? ?HPI: ?Rodney Hampton is a 61 y.o. male presenting on 02/06/2022 for Hypertension ? ? ?HPI ? ? ?Chief Complaint  ?Patient presents with  ? Hypertension  ? ? ?Pt is feeling well.  He has not gotten knee replacement surgery due to cost.  He has 57% cafa but says that would still leave him with a bill for thousands.    He says he hasn't called urology to reschedule due to the costs he saw listed at the orthopedist.  ? ?Pt c/o rash behind both ears for more than months.  He says it seems to improve in the hot months.   ? ? ?Relevant past medical, surgical, family and social history reviewed and updated as indicated. Interim medical history since our last visit reviewed. ?Allergies and medications reviewed and updated. ? ? ?Current Outpatient Medications:  ?  lisinopril (ZESTRIL) 20 MG tablet, Take 1 tablet (20 mg total) by mouth daily., Disp: 90 tablet, Rfl: 1 ?  aspirin EC 81 MG tablet, Take 81 mg by mouth daily. Swallow whole. (Patient not taking: Reported on 02/06/2022), Disp: , Rfl:  ?  ibuprofen (ADVIL) 200 MG tablet, Take 200 mg by mouth every 6 (six) hours as needed. (Patient not taking: Reported on 02/06/2022), Disp: , Rfl:  ? ? ?Review of Systems ? ?Per HPI unless specifically indicated above ? ?   ?Objective:  ?  ?BP 122/70   Pulse 83   Temp 98.3 ?F (36.8 ?C)   Wt 227 lb (103 kg)   SpO2 98%   BMI 29.95 kg/m?   ?Wt Readings from Last 3 Encounters:  ?02/06/22 227 lb (103 kg)  ?11/08/21 174 lb (78.9 kg)  ?09/14/21 174 lb 2 oz (79 kg)  ?  ?Physical Exam ?Vitals reviewed.  ?Constitutional:   ?   General: He is not in acute distress. ?   Appearance: He is well-developed. He is not toxic-appearing.  ?HENT:  ?   Head: Normocephalic and atraumatic.  ?Cardiovascular:  ?   Rate and Rhythm: Normal rate and regular  rhythm.  ?Pulmonary:  ?   Effort: Pulmonary effort is normal.  ?   Breath sounds: Normal breath sounds. No wheezing.  ?Abdominal:  ?   General: Bowel sounds are normal.  ?   Palpations: Abdomen is soft.  ?   Tenderness: There is no abdominal tenderness.  ?Musculoskeletal:  ?   Cervical back: Neck supple.  ?   Right lower leg: No edema.  ?   Left lower leg: No edema.  ?Lymphadenopathy:  ?   Cervical: No cervical adenopathy.  ?Skin: ?   General: Skin is warm and dry.  ?   Comments: Skin behind both ears is thick irritated red flaking.  No cellulitis  ?Neurological:  ?   Mental Status: He is alert and oriented to person, place, and time.  ?Psychiatric:     ?   Behavior: Behavior normal.  ? ? ? ? ? ?   ?Assessment & Plan:  ? ? ? ?Encounter Diagnoses  ?Name Primary?  ? Primary hypertension Yes  ? Tobacco use disorder   ? Elevated PSA   ? Chronic pain of right knee   ? Dermatitis   ? ? ? ?-  Will Check on cost for knee replacement at [facility] and will check to see if he has financial assistance through that facility ?-pt to get non-fasting labs drawn.  He will be called with results ?-pt to continue current medication for BP ?-pt was encouraged to get covid vaccination ?-rx lotrisone for rash behind ears-  tinea v. dermatitis ?-pt to follow up 3 months.  He is to contact office sooner prn ? ?

## 2022-04-24 ENCOUNTER — Other Ambulatory Visit: Payer: Self-pay | Admitting: Physician Assistant

## 2022-04-24 DIAGNOSIS — I10 Essential (primary) hypertension: Secondary | ICD-10-CM

## 2022-04-24 DIAGNOSIS — R972 Elevated prostate specific antigen [PSA]: Secondary | ICD-10-CM

## 2022-05-04 ENCOUNTER — Other Ambulatory Visit (HOSPITAL_COMMUNITY)
Admission: RE | Admit: 2022-05-04 | Discharge: 2022-05-04 | Disposition: A | Discharge status: Home / Self Care | Payer: Self-pay | Source: Ambulatory Visit | Attending: Physician Assistant | Admitting: Physician Assistant

## 2022-05-04 DIAGNOSIS — R972 Elevated prostate specific antigen [PSA]: Secondary | ICD-10-CM | POA: Insufficient documentation

## 2022-05-04 DIAGNOSIS — I10 Essential (primary) hypertension: Secondary | ICD-10-CM | POA: Insufficient documentation

## 2022-05-04 LAB — BASIC METABOLIC PANEL
Anion gap: 5 (ref 5–15)
BUN: 11 mg/dL (ref 8–23)
CO2: 24 mmol/L (ref 22–32)
Calcium: 8.9 mg/dL (ref 8.9–10.3)
Chloride: 107 mmol/L (ref 98–111)
Creatinine, Ser: 0.74 mg/dL (ref 0.61–1.24)
GFR, Estimated: >60 mL/min (ref >60)
Glucose, Bld: 105 mg/dL — ABNORMAL HIGH (ref 70–99)
Potassium: 3.8 mmol/L (ref 3.5–5.1)
Sodium: 136 mmol/L (ref 135–145)

## 2022-05-04 LAB — PSA: Prostatic Specific Antigen: 3.51 ng/mL (ref 0.00–4.00)

## 2022-05-09 ENCOUNTER — Ambulatory Visit: Payer: Self-pay | Admitting: Physician Assistant

## 2022-05-09 ENCOUNTER — Encounter: Payer: Self-pay | Admitting: Physician Assistant

## 2022-05-09 VITALS — BP 120/80 | HR 90 | Temp 97.0°F | Wt 165.0 lb

## 2022-05-09 DIAGNOSIS — F17200 Nicotine dependence, unspecified, uncomplicated: Secondary | ICD-10-CM

## 2022-05-09 DIAGNOSIS — I10 Essential (primary) hypertension: Secondary | ICD-10-CM

## 2022-05-09 DIAGNOSIS — G8929 Other chronic pain: Secondary | ICD-10-CM

## 2022-05-09 NOTE — Progress Notes (Signed)
BP 120/80   Pulse 90   Temp (!) 97 F (36.1 C)   Wt 165 lb (74.8 kg)   SpO2 96%   BMI 21.77 kg/m    Subjective:    Patient ID: Rodney Hampton, male    DOB: 1961/01/09, 61 y.o.   MRN: [id number]  HPI: Rodney Hampton is a 61 y.o. male presenting on 05/09/2022 for Hypertension   HPI   Chief Complaint  Patient presents with   Hypertension     He hasn't been taking his lisinopril for months.    Pt has been to orthopedics and knees TKA.    He continues to smoke.     Relevant past medical, surgical, family and social history reviewed and updated as indicated. Interim medical history since our last visit reviewed. Allergies and medications reviewed and updated.    Current Outpatient Medications:    aspirin EC 81 MG tablet, Take 81 mg by mouth daily. Swallow whole., Disp: , Rfl:    clotrimazole-betamethasone (LOTRISONE) cream, Apply 1 application. topically 2 (two) times daily., Disp: 15 g, Rfl: 1   ibuprofen (ADVIL) 200 MG tablet, Take 200 mg by mouth every 6 (six) hours as needed., Disp: , Rfl:    lisinopril (ZESTRIL) 20 MG tablet, Take 1 tablet (20 mg total) by mouth daily. (Patient not taking: Reported on 05/09/2022), Disp: 90 tablet, Rfl: 1   Review of Systems  Per HPI unless specifically indicated above     Objective:    BP 120/80   Pulse 90   Temp (!) 97 F (36.1 C)   Wt 165 lb (74.8 kg)   SpO2 96%   BMI 21.77 kg/m   Wt Readings from Last 3 Encounters:  05/09/22 165 lb (74.8 kg)  11/08/21 174 lb (78.9 kg)  09/14/21 174 lb 2 oz (79 kg)    Physical Exam Vitals reviewed.  Constitutional:      General: He is not in acute distress.    Appearance: He is well-developed. He is not ill-appearing.  HENT:     Head: Normocephalic and atraumatic.  Cardiovascular:     Rate and Rhythm: Normal rate and regular rhythm.  Pulmonary:     Effort: Pulmonary effort is normal.     Breath sounds: Normal breath sounds. No wheezing.  Abdominal:     General: Bowel  sounds are normal.     Palpations: Abdomen is soft.     Tenderness: There is no abdominal tenderness.  Musculoskeletal:     Cervical back: Neck supple.     Right lower leg: No edema.     Left lower leg: No edema.  Lymphadenopathy:     Cervical: No cervical adenopathy.  Skin:    General: Skin is warm and dry.  Neurological:     Mental Status: He is alert and oriented to person, place, and time.  Psychiatric:        Behavior: Behavior normal.    Results for orders placed or performed during the hospital encounter of 05/04/22  Basic metabolic panel  Result Value Ref Range   Sodium 136 135 - 145 mmol/L   Potassium 3.8 3.5 - 5.1 mmol/L   Chloride 107 98 - 111 mmol/L   CO2 24 22 - 32 mmol/L   Glucose, Bld 105 (H) 70 - 99 mg/dL   BUN 11 8 - 23 mg/dL   Creatinine, Ser 8.46 0.61 - 1.24 mg/dL   Calcium 8.9 8.9 - 96.2 mg/dL   GFR, Estimated >95 >28 mL/min  Anion gap 5 5 - 15  PSA  Result Value Ref Range   Prostatic Specific Antigen 3.51 0.00 - 4.00 ng/mL      Assessment & Plan:    Encounter Diagnoses  Name Primary?   Primary hypertension Yes   Tobacco use disorder    Chronic pain of right knee      -reviewed labs with pt -will check on financial assistance to try to help pt get his TKA -pt encouraged to monitor his bp  -pt to follow up 3 months.  He is to contact office sooner prn

## 2022-05-16 ENCOUNTER — Encounter: Payer: Self-pay | Admitting: Physician Assistant

## 2022-05-22 ENCOUNTER — Telehealth: Payer: Self-pay | Admitting: Student

## 2022-05-22 NOTE — Telephone Encounter (Signed)
Pt called the office returning a call. Pt was informed that he does not qualify for CAFA due to his 401K, but could qualify for [facility] financial assistance. Pt was advised to call [facility] for assistance in filling out his [facility] financial assistance application, once that is done pt is to inform the office, so a rferral to [facility] ortho can be placed. Pt verbalized understanding.

## 2022-06-17 IMAGING — DX DG KNEE COMPLETE 4+V*R*
4 series · 4 of 4 positions shown · non-contrast
Comparison: None.

CLINICAL DATA: Right knee pain and swelling. Injury 1 month ago
when stepping wrong.

EXAM:
RIGHT KNEE - COMPLETE 4+ VIEW

[knee obl (1 of 2)]
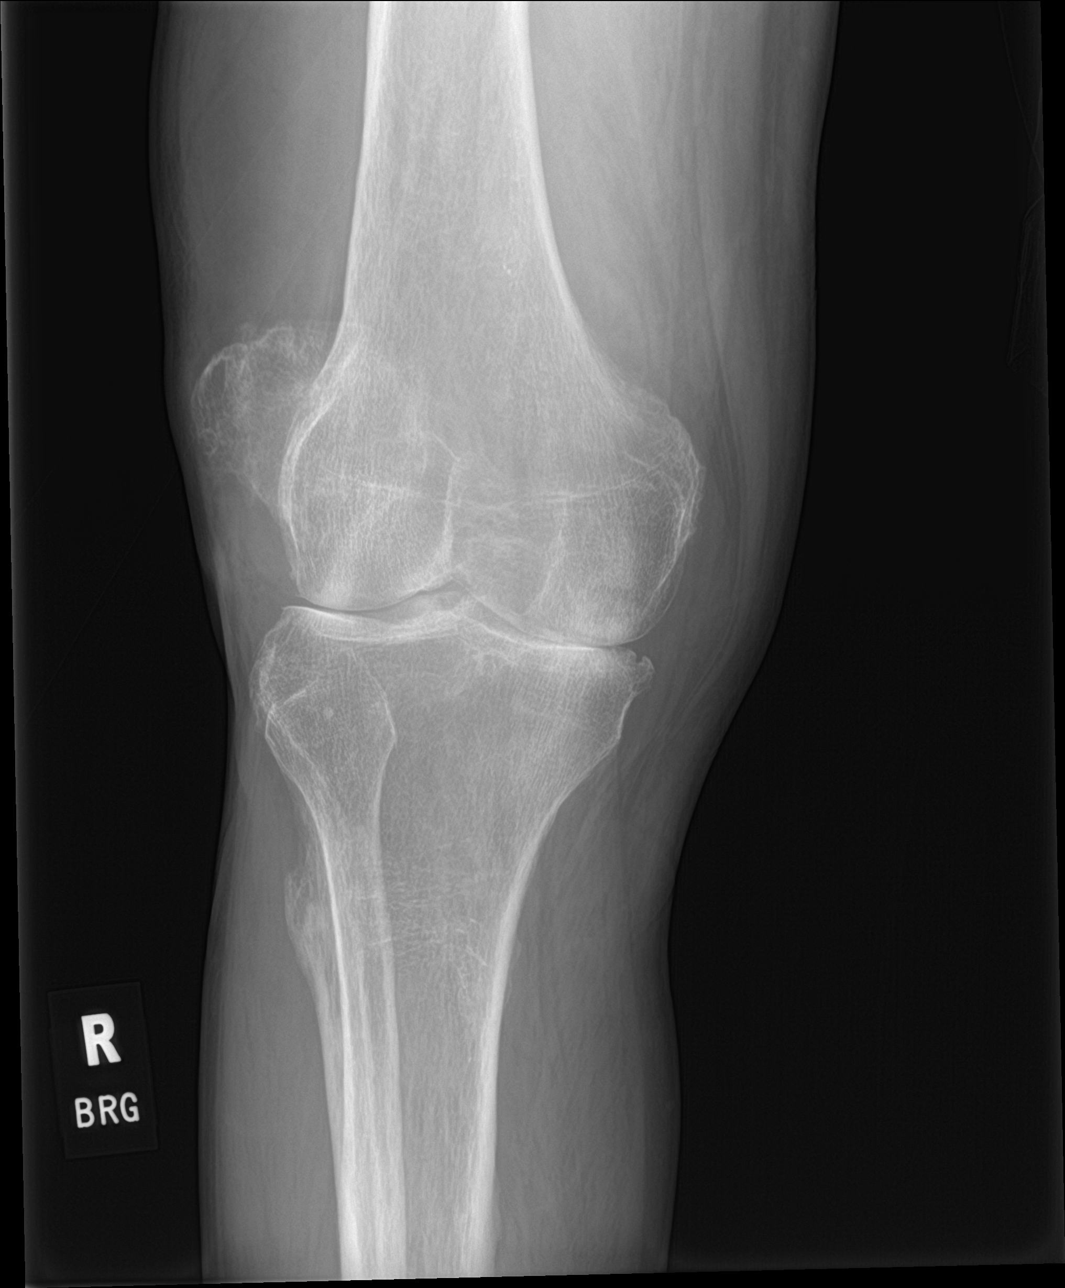

[knee ap]
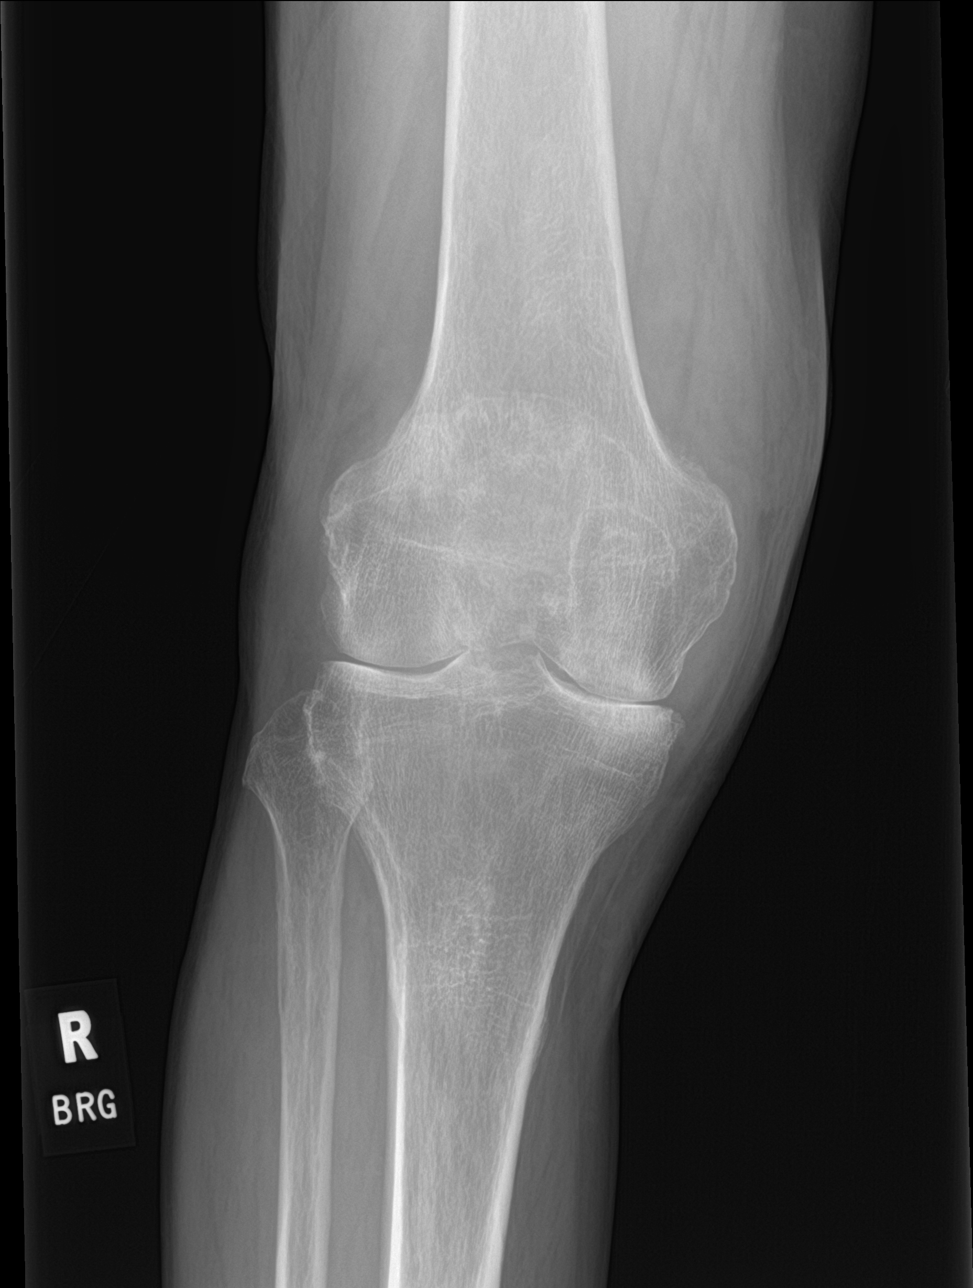

[knee obl (2 of 2)]
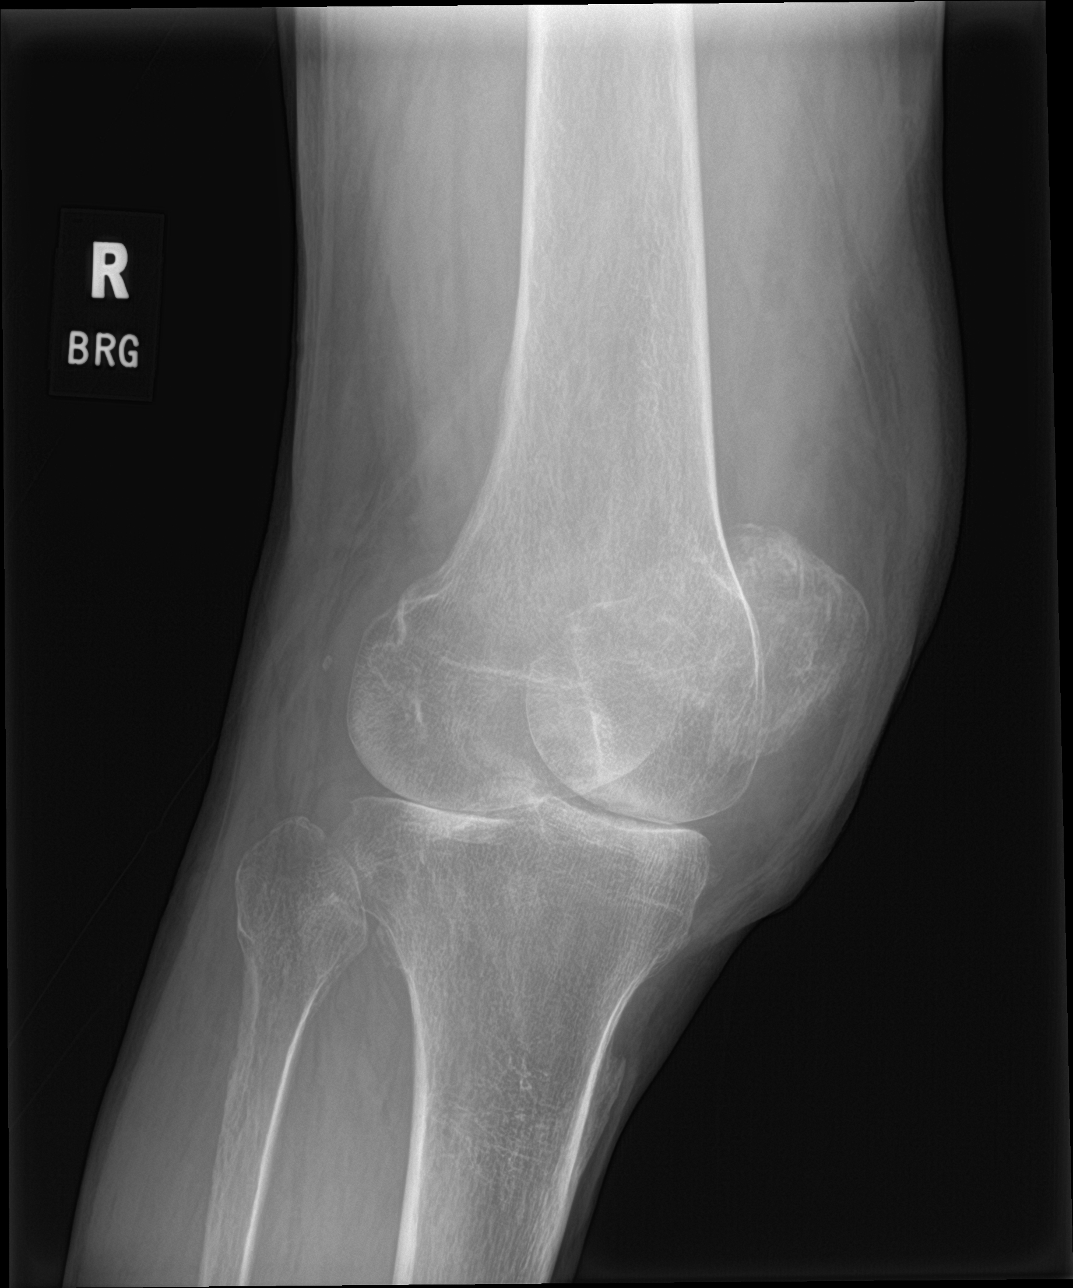

[knee lat]
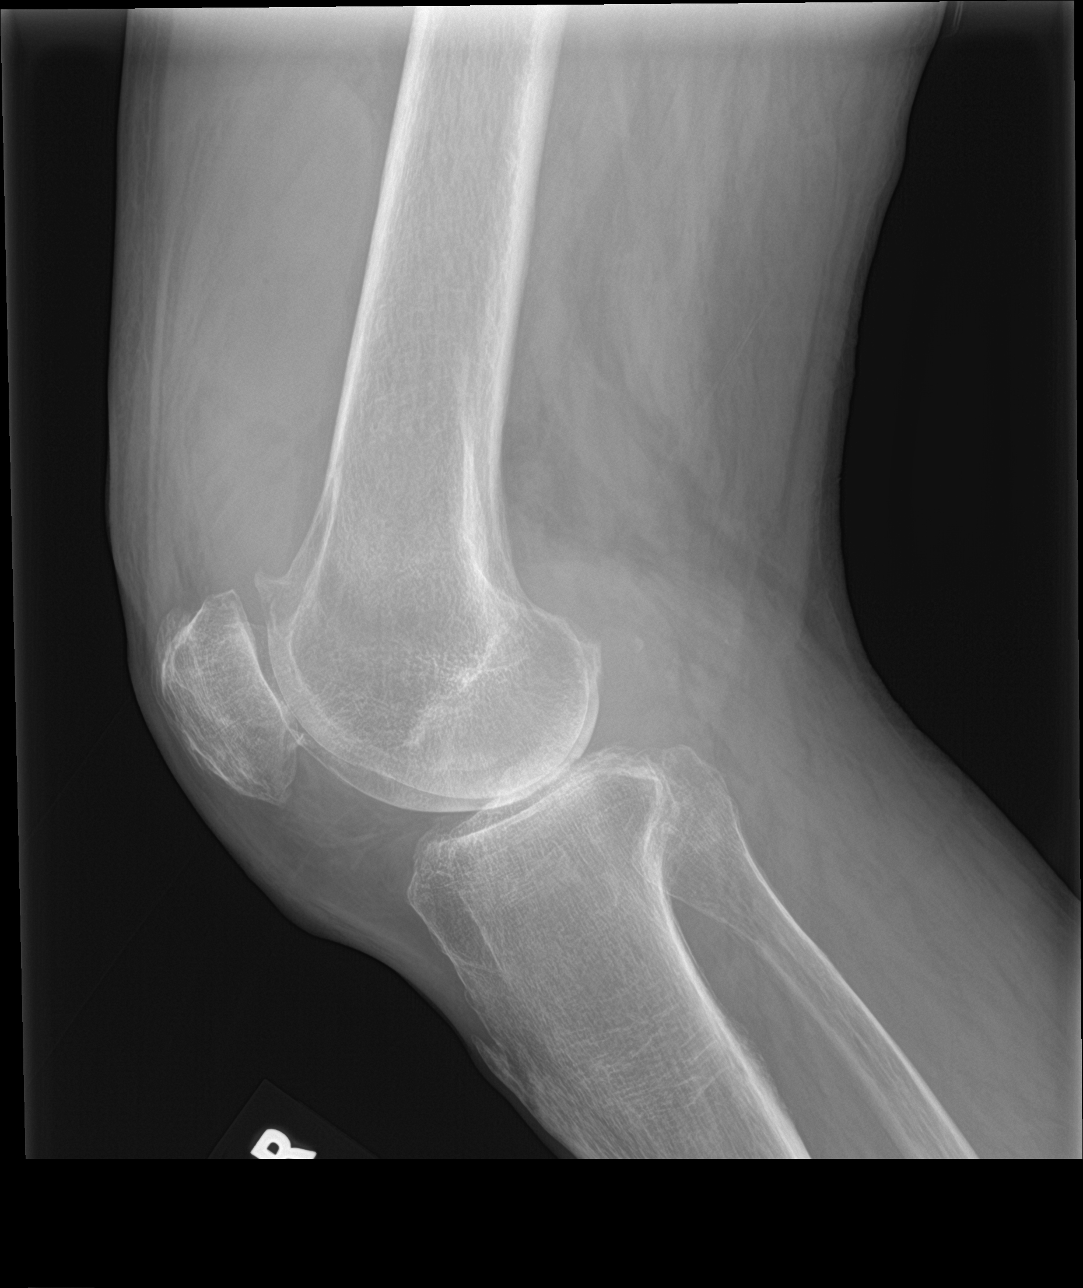

[4 of 4 positions shown; findings below may reference images not displayed]

FINDINGS: Medial and lateral tibiofemoral joint space narrowing. Mild
patellofemoral joint space narrowing. Mild tricompartmental
peripheral spurring. No evidence of acute fracture or dislocation.
There is a moderate joint effusion bones are subjectively under
mineralized.
IMPRESSION: 1. Moderate joint effusion without acute fracture.
2. Mild tricompartmental osteoarthritis.

## 2022-08-13 ENCOUNTER — Ambulatory Visit: Payer: Self-pay | Admitting: Physician Assistant

## 2022-08-13 ENCOUNTER — Other Ambulatory Visit: Payer: Self-pay | Admitting: Physician Assistant

## 2022-08-13 ENCOUNTER — Encounter: Payer: Self-pay | Admitting: Physician Assistant

## 2022-08-13 VITALS — BP 119/79 | HR 91 | Temp 97.6°F

## 2022-08-13 DIAGNOSIS — B353 Tinea pedis: Secondary | ICD-10-CM

## 2022-08-13 DIAGNOSIS — Z1211 Encounter for screening for malignant neoplasm of colon: Secondary | ICD-10-CM

## 2022-08-13 DIAGNOSIS — F17200 Nicotine dependence, unspecified, uncomplicated: Secondary | ICD-10-CM

## 2022-08-13 DIAGNOSIS — G8929 Other chronic pain: Secondary | ICD-10-CM | POA: Insufficient documentation

## 2022-08-13 DIAGNOSIS — Q6689 Other  specified congenital deformities of feet: Secondary | ICD-10-CM

## 2022-08-13 DIAGNOSIS — M1711 Unilateral primary osteoarthritis, right knee: Secondary | ICD-10-CM

## 2022-08-13 NOTE — Progress Notes (Signed)
BP 119/79   Pulse 91   Temp 97.6 F (36.4 C)   SpO2 98%    Subjective:    Patient ID: Rodney Hampton, male    DOB: March 28, 1961, 61 y.o.   MRN: [id number]  HPI: Rodney Hampton is a 61 y.o. male presenting on 08/13/2022 for Hypertension   HPI  Pt is in for recheck bp.   He has history HTN but changed his diet and has been off his medication for about 5 months.     He says he got about 57% cafa which is why he didn't get knee surgery because that would still have left him with a big bill.    He is Still smoking  He also asks about his feet that bother him a lot.   He says he has fungus and hammer toes and claw toes.        Relevant past medical, surgical, family and social history reviewed and updated as indicated. Interim medical history since our last visit reviewed. Allergies and medications reviewed and updated.   Current Outpatient Medications:    aspirin EC 81 MG tablet, Take 81 mg by mouth daily. Swallow whole., Disp: , Rfl:    ibuprofen (ADVIL) 200 MG tablet, Take 200 mg by mouth every 6 (six) hours as needed., Disp: , Rfl:     Review of Systems  Per HPI unless specifically indicated above     Objective:    BP 119/79   Pulse 91   Temp 97.6 F (36.4 C)   SpO2 98%   Wt Readings from Last 3 Encounters:  05/09/22 165 lb (74.8 kg)  11/08/21 174 lb (78.9 kg)  09/14/21 174 lb 2 oz (79 kg)    Physical Exam Vitals reviewed.  Constitutional:      General: He is not in acute distress.    Appearance: He is well-developed. He is not ill-appearing or toxic-appearing.  HENT:     Head: Normocephalic and atraumatic.  Cardiovascular:     Rate and Rhythm: Normal rate and regular rhythm.  Pulmonary:     Effort: Pulmonary effort is normal.     Breath sounds: Normal breath sounds. No wheezing.  Abdominal:     General: Bowel sounds are normal.     Palpations: Abdomen is soft.     Tenderness: There is no abdominal tenderness.  Musculoskeletal:     Cervical back:  Neck supple.     Right knee: Decreased range of motion. Tenderness present.     Right lower leg: No edema.     Left lower leg: No edema.     Right foot: No swelling.     Left foot: No swelling.     Comments: Several toes with significant claw toe deformity.  Right second toe immobile in fully contracted position.   Toes on both feet with thickened chunks of skin and fungus on dorsal surface of toes.   Barely able to get q-tip between toes which have very limited ROM.  There is gooey white in some webspaces.  Unable to determine skin breakdown.  No redness or other evidence secondary infection.    Lymphadenopathy:     Cervical: No cervical adenopathy.  Skin:    General: Skin is warm and dry.  Neurological:     Mental Status: He is alert and oriented to person, place, and time.  Psychiatric:        Behavior: Behavior normal.            Assessment &  Plan:   Encounter Diagnoses  Name Primary?   Osteoarthritis of right knee, unspecified osteoarthritis type Yes   Tobacco use disorder    Tinea pedis of both feet    Claw toe    Screening for colon cancer    Chronic pain of right knee      -pt is given FIT test for colon cancer screening -will Check on cafa/[facility] FA.  He needs orthopedics for his knee.   Will also ask them if they can evaluate his feet/claw toes.    Will call pt with that information later this week -counseled on foot care and encouraged to use antifungal cream.  Encouraged to avoid knocking off to much of the skin chunks at one time to reduce risk of ending up with open wound -encouraged smoking cessation -pt to follow up 6 months.  He is to contact office sooner prn

## 2022-08-15 ENCOUNTER — Telehealth: Payer: Self-pay | Admitting: Student

## 2022-08-15 NOTE — Telephone Encounter (Signed)
Pt to be informed the his [facility] is effective til 05-03-2023. Need to discuss whether he agrees with being referred to [facility] ortho in [place] for his knee and feet.  LPN called x3 on 08-15-2022 and lvm for pt call back.

## 2023-01-31 ENCOUNTER — Encounter: Payer: Self-pay | Admitting: Radiology

## 2023-02-11 ENCOUNTER — Ambulatory Visit: Payer: Self-pay | Admitting: Physician Assistant

## 2023-02-11 ENCOUNTER — Encounter: Payer: Self-pay | Admitting: Physician Assistant

## 2023-02-11 VITALS — BP 155/86 | HR 75 | Temp 96.5°F | Wt 172.5 lb

## 2023-02-11 DIAGNOSIS — G8929 Other chronic pain: Secondary | ICD-10-CM

## 2023-02-11 DIAGNOSIS — M1711 Unilateral primary osteoarthritis, right knee: Secondary | ICD-10-CM

## 2023-02-11 DIAGNOSIS — R030 Elevated blood-pressure reading, without diagnosis of hypertension: Secondary | ICD-10-CM

## 2023-02-11 DIAGNOSIS — H6121 Impacted cerumen, right ear: Secondary | ICD-10-CM

## 2023-02-11 DIAGNOSIS — F17200 Nicotine dependence, unspecified, uncomplicated: Secondary | ICD-10-CM

## 2023-02-11 DIAGNOSIS — H6122 Impacted cerumen, left ear: Secondary | ICD-10-CM

## 2023-02-11 NOTE — Progress Notes (Signed)
BP (!) 155/86   Pulse 75   Temp (!) 96.5 F (35.8 C)   Wt 172 lb 8 oz (78.2 kg)   SpO2 99%   BMI 22.76 kg/m    Subjective:    Patient ID: Rodney Hampton, male    DOB: 07-05-1961, 62 y.o.   MRN: [id number]  HPI: Rodney Hampton is a 62 y.o. male presenting on 02/11/2023 for Follow-up   HPI  Pt is 37yoM in today for routine follow up.  He has history of HTN but changed his diet and was able to control his bp without medication (previous to today's appointment).  Pt continue to have problems with his knee.  We attempted to contact him about referral in the fall but he never called [place] back.  Pt agrees to go to [facility] for orthopedics for his knee.  He was seen by [facility] orthopedist in 2022 but pt didn't get the recommended knee replacement because he did not qualify for financial assistance.    pt says he has frustration at times because he can't do the things he wants to do.   He denies SI, HI.   Pt didn't return his FIT test given in September for colon cancer screening   Pt says he has Wax buildup in ears.  He says the left is worse than the right.      Relevant past medical, surgical, family and social history reviewed and updated as indicated. Interim medical history since our last visit reviewed. Allergies and medications reviewed and updated.   Current Outpatient Medications:    aspirin EC 81 MG tablet, Take 81 mg by mouth daily. Swallow whole., Disp: , Rfl:    ibuprofen (ADVIL) 200 MG tablet, Take 200 mg by mouth every 6 (six) hours as needed., Disp: , Rfl:     Review of Systems  Per HPI unless specifically indicated above     Objective:    BP (!) 155/86   Pulse 75   Temp (!) 96.5 F (35.8 C)   Wt 172 lb 8 oz (78.2 kg)   SpO2 99%   BMI 22.76 kg/m   Wt Readings from Last 3 Encounters:  02/11/23 172 lb 8 oz (78.2 kg)  05/09/22 165 lb (74.8 kg)  11/08/21 174 lb (78.9 kg)    Physical Exam Vitals reviewed.  Constitutional:      General: He is not in acute  distress.    Appearance: He is well-developed. He is not ill-appearing.  HENT:     Head: Normocephalic and atraumatic.     Left Ear: There is impacted cerumen.     Ears:     Comments: There is dry flaking skin debris in the right ear canal which is removed with curette.  The right TM is benign.  There is dry flaking skin in the left ear canal which is removed with curette.  Then is revealed cerumen impaction on the left TM.  Some cerumen is removed with lavage and some remains.   Cardiovascular:     Rate and Rhythm: Normal rate and regular rhythm.  Pulmonary:     Effort: Pulmonary effort is normal.     Breath sounds: Normal breath sounds. No wheezing.  Abdominal:     General: Bowel sounds are normal.     Palpations: Abdomen is soft.     Tenderness: There is no abdominal tenderness.  Musculoskeletal:     Cervical back: Neck supple.     Right knee: Decreased range of motion. Tenderness  present.     Comments: Pt wearing knee brace on the right  Lymphadenopathy:     Cervical: No cervical adenopathy.  Skin:    General: Skin is warm and dry.  Neurological:     Mental Status: He is alert and oriented to person, place, and time.  Psychiatric:        Behavior: Behavior normal.            Assessment & Plan:   Encounter Diagnoses  Name Primary?   Elevated blood pressure reading Yes   Osteoarthritis of right knee, unspecified osteoarthritis type    Tobacco use disorder    Chronic pain of right knee    Impacted cerumen, left ear    Cerumen debris on tympanic membrane, right      HCM: -Pt reminded to return FIT test for colon cancer screening -Pt wants to wait on CT for chest cancer screening/he declined screening  OA R knee -will Refer to [place] for orthopedics.  He has financial assistance through [facility]  pt has MRI done at [facility] in 08/2021  Elevated BP/history HTN -Pt doesn't want to re-start bp meds today but wants to get it rechecked.    Cerumen -will complete lavage when  pt in for bp recheck next week

## 2023-02-20 ENCOUNTER — Ambulatory Visit: Payer: Self-pay | Admitting: Physician Assistant

## 2023-02-20 ENCOUNTER — Encounter: Payer: Self-pay | Admitting: Physician Assistant

## 2023-02-20 VITALS — BP 132/86 | HR 77 | Temp 96.9°F | Wt 169.0 lb

## 2023-02-20 DIAGNOSIS — R030 Elevated blood-pressure reading, without diagnosis of hypertension: Secondary | ICD-10-CM

## 2023-02-20 DIAGNOSIS — H6121 Impacted cerumen, right ear: Secondary | ICD-10-CM

## 2023-02-20 DIAGNOSIS — H6122 Impacted cerumen, left ear: Secondary | ICD-10-CM

## 2023-02-20 DIAGNOSIS — F17200 Nicotine dependence, unspecified, uncomplicated: Secondary | ICD-10-CM

## 2023-02-20 NOTE — Progress Notes (Signed)
   BP 139/85   Pulse 77   Temp (!) 96.9 F (36.1 C)   Wt 169 lb (76.7 kg)   SpO2 99%   BMI 22.30 kg/m    Subjective:    Patient ID: Rodney Hampton, male    DOB: [birth year], 62 y.o.   MRN: [id number]  HPI: Rodney Hampton is a 62 y.o. male presenting on 02/20/2023 for Blood Pressure Check and Cerumen Impaction   HPI  Chief Complaint  Patient presents with   Blood Pressure Check   Cerumen Impaction    Pt says he has an appointment with the orthopedist next week for his knee.     Relevant past medical, surgical, family and social history reviewed and updated as indicated. Interim medical history since our last visit reviewed. Allergies and medications reviewed and updated.    Current Outpatient Medications:    aspirin EC 81 MG tablet, Take 81 mg by mouth daily. Swallow whole., Disp: , Rfl:    ibuprofen (ADVIL) 200 MG tablet, Take 200 mg by mouth every 6 (six) hours as needed., Disp: , Rfl:    Review of Systems  Per HPI unless specifically indicated above     Objective:    BP 139/85   Pulse 77   Temp (!) 96.9 F (36.1 C)   Wt 169 lb (76.7 kg)   SpO2 99%   BMI 22.30 kg/m   Wt Readings from Last 3 Encounters:  02/20/23 169 lb (76.7 kg)  02/11/23 172 lb 8 oz (78.2 kg)  05/09/22 165 lb (74.8 kg)     Vitals:   02/20/23 0918 02/20/23 1024  BP: 139/85 132/86  Pulse: 77   Temp: (!) 96.9 F (36.1 C)   SpO2: 99%      .viota Physical Exam Constitutional:      General: He is not in acute distress.    Appearance: He is not toxic-appearing.  HENT:     Head: Normocephalic and atraumatic.     Right Ear: A foreign body is present.     Left Ear: There is impacted cerumen.     Ears:     Comments: Scattered wax and skin in R ear canal and impacted L.  After lavage, TMs normal B and pt reports improved hearing Pulmonary:     Effort: No respiratory distress.  Neurological:     Mental Status: He is alert and oriented to person, place, and time.  Psychiatric:         Behavior: Behavior normal.           Assessment & Plan:    Encounter Diagnoses  Name Primary?   Impacted cerumen, left ear Yes   Cerumen debris on tympanic membrane, right    Tobacco use disorder    Elevated blood pressure reading      -BP improved.  Pt encouraged to watch his diet as it affects his bp.  He will RTO 3 months for bp recheck.  He is to contact office sooner prn -pt was reminded to return his FIT test for colon cancer screening

## 2023-02-27 ENCOUNTER — Other Ambulatory Visit: Payer: Self-pay | Admitting: Physician Assistant

## 2023-02-27 DIAGNOSIS — Z1211 Encounter for screening for malignant neoplasm of colon: Secondary | ICD-10-CM

## 2023-02-27 LAB — POC FIT TEST STOOL: Fecal Occult Blood: NEGATIVE

## 2023-05-06 ENCOUNTER — Other Ambulatory Visit: Payer: Self-pay | Admitting: Physician Assistant

## 2023-05-06 DIAGNOSIS — Z87898 Personal history of other specified conditions: Secondary | ICD-10-CM

## 2023-05-06 DIAGNOSIS — Z125 Encounter for screening for malignant neoplasm of prostate: Secondary | ICD-10-CM

## 2023-05-06 DIAGNOSIS — R030 Elevated blood-pressure reading, without diagnosis of hypertension: Secondary | ICD-10-CM

## 2023-05-23 ENCOUNTER — Ambulatory Visit: Payer: Self-pay | Admitting: Physician Assistant

## 2023-05-30 ENCOUNTER — Ambulatory Visit: Payer: Self-pay | Admitting: Physician Assistant

## 2023-06-18 ENCOUNTER — Telehealth: Payer: Self-pay

## 2023-06-18 NOTE — Telephone Encounter (Signed)
Called to follow up and wellness call with [facility] client. He reports no needs, reviewed his upcoming appointments with [facility] on 06/26/23. He states he is still having some issues with his knee, encouraged to discuss with his provider at his appointment.  Med[employer]ist eligible if needed.  Food: reports no needs for food at this time  Housing/Utilities reports no needs at this time. States he has disability now Safety/BH needs: none identified Transportation: no current needs  Discussed he will be turning 63 in January 2025, he states he did meet with June Carlson at the [facility] and went over his options, he states he has some decisions to make.  Medicaid: He states he has not heard or received any correspondence from DSS about Medicaid. Checked [employer] and confirmed not showing Medicaid.  Discussed client to call if he has any questions, needs or concerns that [facility] may help with. He states understanding.   Francee Nodal RN [employer]

## 2023-06-24 ENCOUNTER — Other Ambulatory Visit (HOSPITAL_COMMUNITY)
Admission: RE | Admit: 2023-06-24 | Discharge: 2023-06-24 | Disposition: A | Discharge status: Home / Self Care | Payer: Self-pay | Source: Ambulatory Visit | Attending: Physician Assistant | Admitting: Physician Assistant

## 2023-06-24 DIAGNOSIS — R030 Elevated blood-pressure reading, without diagnosis of hypertension: Secondary | ICD-10-CM | POA: Insufficient documentation

## 2023-06-24 DIAGNOSIS — Z87898 Personal history of other specified conditions: Secondary | ICD-10-CM | POA: Insufficient documentation

## 2023-06-24 DIAGNOSIS — Z125 Encounter for screening for malignant neoplasm of prostate: Secondary | ICD-10-CM | POA: Insufficient documentation

## 2023-06-24 LAB — BASIC METABOLIC PANEL
Anion gap: 7 (ref 5–15)
BUN: 9 mg/dL (ref 8–23)
CO2: 23 mmol/L (ref 22–32)
Calcium: 8.8 mg/dL — ABNORMAL LOW (ref 8.9–10.3)
Chloride: 102 mmol/L (ref 98–111)
Creatinine, Ser: 0.65 mg/dL (ref 0.61–1.24)
GFR, Estimated: >60 mL/min (ref >60)
Glucose, Bld: 110 mg/dL — ABNORMAL HIGH (ref 70–99)
Potassium: 3.8 mmol/L (ref 3.5–5.1)
Sodium: 132 mmol/L — ABNORMAL LOW (ref 135–145)

## 2023-06-24 LAB — PSA: Prostatic Specific Antigen: 5.34 ng/mL — ABNORMAL HIGH (ref 0.00–4.00)

## 2023-06-26 ENCOUNTER — Ambulatory Visit: Payer: Self-pay | Admitting: Physician Assistant

## 2023-06-26 ENCOUNTER — Encounter: Payer: Self-pay | Admitting: Physician Assistant

## 2023-06-26 VITALS — BP 133/88 | HR 94 | Temp 98.0°F | Wt 175.0 lb

## 2023-06-26 DIAGNOSIS — F17200 Nicotine dependence, unspecified, uncomplicated: Secondary | ICD-10-CM

## 2023-06-26 DIAGNOSIS — L219 Seborrheic dermatitis, unspecified: Secondary | ICD-10-CM

## 2023-06-26 DIAGNOSIS — Z2821 Immunization not carried out because of patient refusal: Secondary | ICD-10-CM

## 2023-06-26 DIAGNOSIS — M1711 Unilateral primary osteoarthritis, right knee: Secondary | ICD-10-CM

## 2023-06-26 DIAGNOSIS — R972 Elevated prostate specific antigen [PSA]: Secondary | ICD-10-CM

## 2023-06-26 DIAGNOSIS — H6123 Impacted cerumen, bilateral: Secondary | ICD-10-CM

## 2023-06-26 DIAGNOSIS — G8929 Other chronic pain: Secondary | ICD-10-CM

## 2023-06-26 NOTE — Patient Instructions (Signed)
T-Gel or Selsun Blue Shampoo  --------------------------------------------------------------  Seborrheic Dermatitis, Adult Seborrheic dermatitis is a skin disease that causes red, scaly patches. It often occurs on the scalp, where it may be called dandruff. The patches may also appear on other parts of the body. Skin patches tend to occur where there are a lot of oil glands in the skin. Areas of the body that may be affected include: The scalp. The face, eyebrows, and ears. The area around a beard. Skin folds of the body. This includes the armpits, groin, and buttocks. The chest. The condition is often long-lasting (chronic). It may come and go for no known reason. It may be activated by a trigger, such as: Cold weather. Being out in the sun. Stress. Drinking alcohol. What are the causes? The cause of this condition is not known. It may be related to having too much yeast on the skin or changes in how your body's disease-fighting system (immune system) works. What increases the risk? You may be more likely to develop this condition if: You have a weak immune system. You are 64 years old or older. You have other conditions, such as: Human immunodeficiency virus (HIV) or acquired immunodeficiency virus (AIDS). Parkinson's disease. Mood disorders, such as depression. Liver problems. Obesity. What are the signs or symptoms? Symptoms of this condition include: Thick scales on the scalp. Redness on the face or in the armpits. Skin that is flaky. The flakes may be white or yellow. Skin that seems oily or dry but is not helped with moisturizers. Itching or burning in the affected areas. How is this diagnosed? This condition is diagnosed with a medical history and physical exam. A sample of your skin may be tested (skin biopsy). You may need to see a skin specialist (dermatologist). How is this treated? There is no cure for this condition, but treatment can help to manage the symptoms.  You may get treatment to remove scales, lower the risk of skin infection, and reduce swelling or itching. Treatment may include: Medicated shampoos, moisturizing creams, or ointments. Creams that reduce skin yeast. Creams that reduce swelling and irritation (steroids). Follow these instructions at home: Skin care Use any medicated shampoo, skin creams, or ointments only as told by your health care provider. Do not use skin products that contain alcohol. Take lukewarm baths or showers. Avoid very hot water. When you are outside, wear a hat and clothes that block UV light. General instructions Apply over-the-counter and prescription medicines only as told by your health care provider. Learn what triggers your symptoms so you can avoid these things. Use techniques for stress reduction, such as meditation or yoga. Do not drink alcohol if your health care provider tells you not to drink. Keep all follow-up visits. Your health care provider will check your skin to make sure the treatments are helping. Where to find more information American Academy of Dermatology: [employer]She[employer].si Contact a health care provider if: Your symptoms do not get better with treatment. Your symptoms get worse. You have new symptoms. Get help right away if: Your condition quickly gets worse, even with treatment. This information is not intended to replace advice given to you by your health care provider. Make sure you discuss any questions you have with your health care provider. Document Revised: 04/20/2022 Document Reviewed: 04/20/2022 Elsevier Patient Education  2024 [employer].

## 2023-06-26 NOTE — Progress Notes (Signed)
BP 133/88   Pulse 94   Temp 98 F (36.7 C)   Wt 175 lb (79.4 kg)   SpO2 98%   BMI 23.09 kg/m    Subjective:    Patient ID: Rodney Hampton, male    DOB: [birth year], 62 y.o.   MRN: [id number]  HPI: Rodney Hampton is a 62 y.o. male presenting on 06/26/2023 for Hypertension   HPI   Chief Complaint  Patient presents with   Hypertension    Pt is in today for recheck bp.    He went to orthopedics for his knee, last seen in April.  He says shot in the knee helped until recently.    He says he hasn't gotten too bad to go back to orthopedics yet.    He complains of his ears full again but he has no pain in the ears.    Relevant past medical, surgical, family and social history reviewed and updated as indicated. Interim medical history since our last visit reviewed. Allergies and medications reviewed and updated.    Current Outpatient Medications:    aspirin EC 81 MG tablet, Take 81 mg by mouth daily. Swallow whole., Disp: , Rfl:    ibuprofen (ADVIL) 200 MG tablet, Take 200 mg by mouth every 6 (six) hours as needed., Disp: , Rfl:     Review of Systems  Per HPI unless specifically indicated above     Objective:    BP 133/88   Pulse 94   Temp 98 F (36.7 C)   Wt 175 lb (79.4 kg)   SpO2 98%   BMI 23.09 kg/m   Wt Readings from Last 3 Encounters:  06/26/23 175 lb (79.4 kg)  02/20/23 169 lb (76.7 kg)  02/11/23 172 lb 8 oz (78.2 kg)    Physical Exam Vitals reviewed.  Constitutional:      General: He is not in acute distress.    Appearance: He is well-developed. He is not ill-appearing.  HENT:     Head: Normocephalic and atraumatic.     Right Ear: There is impacted cerumen.     Left Ear: There is impacted cerumen.     Ears:     Comments: After lavage, TMs normal bilaterally Cardiovascular:     Rate and Rhythm: Normal rate and regular rhythm.  Pulmonary:     Effort: Pulmonary effort is normal.     Breath sounds: Normal breath sounds. No wheezing.   Abdominal:     General: Bowel sounds are normal.     Palpations: Abdomen is soft.     Tenderness: There is no abdominal tenderness.  Musculoskeletal:     Cervical back: Neck supple.     Right lower leg: No edema.     Left lower leg: No edema.  Lymphadenopathy:     Cervical: No cervical adenopathy.  Skin:    General: Skin is warm and dry.     Comments: Scalp flaking  Neurological:     Mental Status: He is alert and oriented to person, place, and time.  Psychiatric:        Behavior: Behavior normal.     Results for orders placed or performed during the hospital encounter of 06/24/23  Basic metabolic panel  Result Value Ref Range   Sodium 132 (L) 135 - 145 mmol/L   Potassium 3.8 3.5 - 5.1 mmol/L   Chloride 102 98 - 111 mmol/L   CO2 23 22 - 32 mmol/L   Glucose, Bld 110 (H) 70 - 99  mg/dL   BUN 9 8 - 23 mg/dL   Creatinine, Ser 9.56 0.61 - 1.24 mg/dL   Calcium 8.8 (L) 8.9 - 10.3 mg/dL   GFR, Estimated >21 >30 mL/min   Anion gap 7 5 - 15  PSA  Result Value Ref Range   Prostatic Specific Antigen 5.34 (H) 0.00 - 4.00 ng/mL      Assessment & Plan:   Encounter Diagnoses  Name Primary?   Elevated PSA Yes   Bilateral impacted cerumen    Seborrheic dermatitis    Tobacco use disorder    Osteoarthritis of right knee, unspecified osteoarthritis type    Chronic pain of right knee    COVID-19 vaccination declined      -reviewed labs with pt -Refer to urologist for elevated PSA -he is given application for [employer] finanacial assistance -Discussed covid vaccination.  He declined to get vaccination -refer for chest CT screening- [facility] -Ear lavage performed -bp stable today.  Will continue to monitor -pt to follow up 3 months.  He is to contact office sooner prn

## 2023-08-12 ENCOUNTER — Encounter: Payer: Self-pay | Admitting: Urology

## 2023-08-12 ENCOUNTER — Ambulatory Visit (INDEPENDENT_AMBULATORY_CARE_PROVIDER_SITE_OTHER): Payer: Self-pay | Admitting: Urology

## 2023-08-12 VITALS — BP 153/98 | HR 91

## 2023-08-12 DIAGNOSIS — R351 Nocturia: Secondary | ICD-10-CM

## 2023-08-12 DIAGNOSIS — N401 Enlarged prostate with lower urinary tract symptoms: Secondary | ICD-10-CM

## 2023-08-12 DIAGNOSIS — R972 Elevated prostate specific antigen [PSA]: Secondary | ICD-10-CM

## 2023-08-12 DIAGNOSIS — N138 Other obstructive and reflux uropathy: Secondary | ICD-10-CM

## 2023-08-12 LAB — URINALYSIS, ROUTINE W REFLEX MICROSCOPIC
Bilirubin, UA: NEGATIVE
Glucose, UA: NEGATIVE
Ketones, UA: NEGATIVE
Leukocytes,UA: NEGATIVE
Nitrite, UA: NEGATIVE
Specific Gravity, UA: 1.03 (ref 1.005–1.030)
Urobilinogen, Ur: 0.2 mg/dL (ref 0.2–1.0)
pH, UA: 6 (ref 5.0–7.5)

## 2023-08-12 LAB — MICROSCOPIC EXAMINATION: Bacteria, UA: NONE SEEN

## 2023-08-12 NOTE — Patient Instructions (Signed)

## 2023-08-12 NOTE — Progress Notes (Unsigned)
08/12/2023 11:52 AM   Rodney Hampton 09-03-1961 [id number]  Referring provider: Jacquelin Hawking, PA-C [address] [place],  [place] [address]  Elevated PSA   HPI: Rodney Hampton is a 62yo here for followup for elevated PSA. PSA 5.34 one month ago. PSA 3.5 last year and 4.55 two years ago. IPSS 6 QOl 2 on no BPH therapy. Nocturia 1x. He has rare dribbling and very rare urge incontinence. Urine stream is fair. No feeling of incomplete emptying. No family hx of prostate cancer.    PMH: Past Medical History:  Diagnosis Date   Elevated PSA    Hyperlipidemia    Hypertension    Tobacco use     Surgical History: Past Surgical History:  Procedure Laterality Date   MANDIBLE FRACTURE SURGERY      Home Medications:  Allergies as of 08/12/2023   No Known Allergies      Medication List        Accurate as of August 12, 2023 11:52 AM. If you have any questions, ask your nurse or doctor.          aspirin EC 81 MG tablet Take 81 mg by mouth daily. Swallow whole.   ibuprofen 200 MG tablet Commonly known as: ADVIL Take 200 mg by mouth every 6 (six) hours as needed.        Allergies: No Known Allergies  Family History: Family History  Problem Relation Age of Onset   Cancer Mother    Leukemia Mother     Social History:  reports that he has been smoking cigarettes. He has never used smokeless tobacco. He reports that he does not currently use alcohol. He reports that he does not currently use drugs after having used the following drugs: Marijuana.  ROS: All other review of systems were reviewed and are negative except what is noted above in HPI  Physical Exam: BP (!) 153/98   Pulse 91   Constitutional:  Alert and oriented, No acute distress. HEENT: [place] AT, moist mucus membranes.  Trachea midline, no masses. Cardiovascular: No clubbing, cyanosis, or edema. Respiratory: Normal respiratory effort, no increased work of breathing. GI: Abdomen is soft, nontender,  nondistended, no abdominal masses GU: No CVA tenderness. Circumcised phallus. No masses/lesions on penis, testis, scrotum. Prostate 80g smooth no nodules no induration.  Lymph: No cervical or inguinal lymphadenopathy. Skin: No rashes, bruises or suspicious lesions. Neurologic: Grossly intact, no focal deficits, moving all 4 extremities. Psychiatric: Normal mood and affect.  Laboratory Data: No results found for: "WBC", "HGB", "HCT", "MCV", "PLT"  Lab Results  Component Value Date   CREATININE 0.65 06/24/2023    No results found for: "PSA"  No results found for: "TESTOSTERONE"  No results found for: "HGBA1C"  Urinalysis No results found for: "COLORURINE", "APPEARANCEUR", "LABSPEC", "PHURINE", "GLUCOSEU", "HGBUR", "BILIRUBINUR", "KETONESUR", "PROTEINUR", "UROBILINOGEN", "NITRITE", "LEUKOCYTESUR"  No results found for: "LABMICR", "WBCUA", "RBCUA", "LABEPIT", "MUCUS", "BACTERIA"  Pertinent Imaging:  No results found for this or any previous visit.  No results found for this or any previous visit.  No results found for this or any previous visit.  No results found for this or any previous visit.  No results found for this or any previous visit.  No valid procedures specified. No results found for this or any previous visit.  No results found for this or any previous visit.   Assessment & Plan:    1. Elevated PSA The patient and I talked about etiologies of elevated PSA.  We discussed the  possible relationship between elevated PSA and prostate cancer, BPH, prostatitis, infection trauma and recent ejaculations.   I recommended that we follow-up with a repeat PSA in 3 months.  If it remains elevated with a positive rising trend we will discuss prostate biopsy at his follow-up appointment.  - Urinalysis, Routine w reflex microscopic  2. BPH with nocturia -patient defers therapy at this time  No follow-ups on file.  Rodney Aye, MD  [facility] [place]

## 2023-09-04 ENCOUNTER — Telehealth: Payer: Self-pay | Admitting: Student

## 2023-09-04 ENCOUNTER — Encounter: Payer: Self-pay | Admitting: Physician Assistant

## 2023-09-04 NOTE — Telephone Encounter (Signed)
Per PA, pt is to be notified that 08-20-23 CT shows emphysema and Aortic Arth. Pt is recommended to stop smoking, exercise regularly, and follow lowfat diet. Medication options to be discussed at his OV on 09-25-23.

## 2023-09-12 ENCOUNTER — Encounter: Payer: Self-pay | Admitting: Student

## 2023-09-12 NOTE — Progress Notes (Signed)
Letter mailed 09-12-23

## 2023-09-12 NOTE — Telephone Encounter (Signed)
Mailed letter out to patient notifying him of his results.

## 2023-09-25 ENCOUNTER — Ambulatory Visit: Payer: Self-pay | Admitting: Physician Assistant

## 2023-09-25 ENCOUNTER — Encounter: Payer: Self-pay | Admitting: Physician Assistant

## 2023-09-25 ENCOUNTER — Other Ambulatory Visit (HOSPITAL_COMMUNITY)
Admission: RE | Admit: 2023-09-25 | Discharge: 2023-09-25 | Disposition: A | Discharge status: Home / Self Care | Payer: Self-pay | Source: Ambulatory Visit | Attending: Physician Assistant | Admitting: Physician Assistant

## 2023-09-25 VITALS — BP 136/85 | HR 86 | Temp 97.5°F | Wt 181.8 lb

## 2023-09-25 DIAGNOSIS — I10 Essential (primary) hypertension: Secondary | ICD-10-CM

## 2023-09-25 DIAGNOSIS — I700 Atherosclerosis of aorta: Secondary | ICD-10-CM

## 2023-09-25 DIAGNOSIS — F17200 Nicotine dependence, unspecified, uncomplicated: Secondary | ICD-10-CM

## 2023-09-25 DIAGNOSIS — J439 Emphysema, unspecified: Secondary | ICD-10-CM | POA: Insufficient documentation

## 2023-09-25 DIAGNOSIS — H6123 Impacted cerumen, bilateral: Secondary | ICD-10-CM

## 2023-09-25 DIAGNOSIS — R972 Elevated prostate specific antigen [PSA]: Secondary | ICD-10-CM

## 2023-09-25 DIAGNOSIS — M1711 Unilateral primary osteoarthritis, right knee: Secondary | ICD-10-CM | POA: Insufficient documentation

## 2023-09-25 LAB — COMPREHENSIVE METABOLIC PANEL
ALT: 17 U/L (ref 0–44)
AST: 21 U/L (ref 15–41)
Albumin: 4.1 g/dL (ref 3.5–5.0)
Alkaline Phosphatase: 71 U/L (ref 38–126)
Anion gap: 9 (ref 5–15)
BUN: 9 mg/dL (ref 8–23)
CO2: 27 mmol/L (ref 22–32)
Calcium: 9.3 mg/dL (ref 8.9–10.3)
Chloride: 101 mmol/L (ref 98–111)
Creatinine, Ser: 0.71 mg/dL (ref 0.61–1.24)
GFR, Estimated: >60 mL/min (ref >60)
Glucose, Bld: 110 mg/dL — ABNORMAL HIGH (ref 70–99)
Potassium: 4 mmol/L (ref 3.5–5.1)
Sodium: 137 mmol/L (ref 135–145)
Total Bilirubin: 0.5 mg/dL (ref 0.3–1.2)
Total Protein: 8.5 g/dL — ABNORMAL HIGH (ref 6.5–8.1)

## 2023-09-25 LAB — LIPID PANEL
Cholesterol: 183 mg/dL (ref 0–200)
HDL: 26 mg/dL — ABNORMAL LOW (ref >40)
LDL Cholesterol: 129 mg/dL — ABNORMAL HIGH (ref 0–99)
Total CHOL/HDL Ratio: 7 {ratio}
Triglycerides: 142 mg/dL (ref <150)
VLDL: 28 mg/dL (ref 0–40)

## 2023-09-25 MED ORDER — ATORVASTATIN CALCIUM 20 MG PO TABS: 20.0000 mg | ORAL_TABLET | Freq: Every day | ORAL | 0 refills | Status: AC

## 2023-09-25 MED ORDER — LISINOPRIL 10 MG PO TABS: 10.0000 mg | ORAL_TABLET | Freq: Every day | ORAL | 0 refills | Status: AC

## 2023-09-25 NOTE — Progress Notes (Signed)
BP 136/85   Pulse 86   Temp (!) 97.5 F (36.4 C)   Wt 181 lb 12 oz (82.4 kg)   SpO2 98%   BMI 23.98 kg/m    Subjective:    Patient ID: Rodney Hampton, male    DOB: January 04, 1961, 62 y.o.   MRN: [id number]  HPI: Rodney Hampton is a 62 y.o. male presenting on 09/25/2023 for Follow-up and Ear Fullness (L ear feels like it has water in it and feels like it pop. Both ears feel full. Pt would like to get his ear checked and possibly cleaned.)   HPI  Chief Complaint  Patient presents with   Follow-up   Ear Fullness    L ear feels like it has water in it and feels like it pop. Both ears feel full. Pt would like to get his ear checked and possibly cleaned.     Pt says he is not having any cp or sob.  He continues to use a cane due to his OA of the knee.   He continues to smoke.   Relevant past medical, surgical, family and social history reviewed and updated as indicated. Interim medical history since our last visit reviewed. Allergies and medications reviewed and updated.  CURRENT MEDS: Asa 81mg  every day IBU prn  Review of Systems  Per HPI unless specifically indicated above     Objective:    BP 136/85   Pulse 86   Temp (!) 97.5 F (36.4 C)   Wt 181 lb 12 oz (82.4 kg)   SpO2 98%   BMI 23.98 kg/m   Wt Readings from Last 3 Encounters:  09/25/23 181 lb 12 oz (82.4 kg)  06/26/23 175 lb (79.4 kg)  02/20/23 169 lb (76.7 kg)    Physical Exam Vitals reviewed.  Constitutional:      General: He is not in acute distress.    Appearance: He is well-developed. He is not toxic-appearing.  HENT:     Head: Normocephalic and atraumatic.     Right Ear: There is impacted cerumen.     Left Ear: There is impacted cerumen.  Cardiovascular:     Rate and Rhythm: Normal rate and regular rhythm.  Pulmonary:     Effort: Pulmonary effort is normal.     Breath sounds: Normal breath sounds. No wheezing.  Abdominal:     General: Bowel sounds are normal.     Palpations: Abdomen is  soft.     Tenderness: There is no abdominal tenderness.  Musculoskeletal:     Cervical back: Neck supple.     Right lower leg: No edema.     Left lower leg: No edema.  Lymphadenopathy:     Cervical: No cervical adenopathy.  Skin:    General: Skin is warm and dry.  Neurological:     Mental Status: He is alert and oriented to person, place, and time.  Psychiatric:        Behavior: Behavior normal.           Assessment & Plan:    Encounter Diagnoses  Name Primary?   Primary hypertension Yes   Bilateral impacted cerumen    Atherosclerosis of aorta (HCC)    Pulmonary emphysema, unspecified emphysema type (HCC)    Tobacco use disorder    Osteoarthritis of right knee, unspecified osteoarthritis type    Elevated PSA      -long conversation with pt about results CT and risks including smoking.   -recommended starting anti-hypertensive medication and  he agrees.  Start lisinopril 10mg  -recommended starting statin and he agrees.  Rx atorvastatin 20mg .  Pt will get his labs updated. -recommended smoking cessation -he has appt to follow up with urology in January for elevated PSA -pt medicare starts December 04, 2023 -pt to follow up 1 month to recheck bp and tolerance to new medications.  He is to contact office sooner prn

## 2023-11-05 ENCOUNTER — Ambulatory Visit: Payer: Self-pay | Admitting: Physician Assistant

## 2023-11-07 ENCOUNTER — Ambulatory Visit: Payer: Self-pay | Admitting: Physician Assistant

## 2023-11-07 ENCOUNTER — Encounter: Payer: Self-pay | Admitting: Physician Assistant

## 2023-11-07 VITALS — BP 119/78 | HR 77 | Temp 97.7°F | Wt 182.0 lb

## 2023-11-07 DIAGNOSIS — J439 Emphysema, unspecified: Secondary | ICD-10-CM

## 2023-11-07 DIAGNOSIS — I10 Essential (primary) hypertension: Secondary | ICD-10-CM

## 2023-11-07 DIAGNOSIS — F17200 Nicotine dependence, unspecified, uncomplicated: Secondary | ICD-10-CM

## 2023-11-07 DIAGNOSIS — H6123 Impacted cerumen, bilateral: Secondary | ICD-10-CM

## 2023-11-07 DIAGNOSIS — I700 Atherosclerosis of aorta: Secondary | ICD-10-CM

## 2023-11-07 DIAGNOSIS — M1711 Unilateral primary osteoarthritis, right knee: Secondary | ICD-10-CM

## 2023-11-07 NOTE — Progress Notes (Signed)
BP 119/78   Pulse 77   Temp 97.7 F (36.5 C)   Wt 182 lb (82.6 kg)   SpO2 98%   BMI 24.01 kg/m    Subjective:    Patient ID: Rodney Hampton, male    DOB: 1961/11/17, 62 y.o.   MRN: [id number]  HPI: Rodney Hampton is a 62 y.o. male presenting on 11/07/2023 for Blood Pressure Check   HPI  Chief Complaint  Patient presents with   Blood Pressure Check    Pt feels well.  Requests ear check (has hx impaction).  His medicare starts January 1. He is planning to go to [facility] but hasn't gotten an appointment yet.    Relevant past medical, surgical, family and social history reviewed and updated as indicated. Interim medical history since our last visit reviewed. Allergies and medications reviewed and updated.   Current Outpatient Medications:    aspirin EC 81 MG tablet, Take 81 mg by mouth daily. Swallow whole., Disp: , Rfl:    atorvastatin (LIPITOR) 20 MG tablet, Take 1 tablet (20 mg total) by mouth daily., Disp: 90 tablet, Rfl: 0   ibuprofen (ADVIL) 200 MG tablet, Take 200 mg by mouth every 6 (six) hours as needed., Disp: , Rfl:    lisinopril (ZESTRIL) 10 MG tablet, Take 1 tablet (10 mg total) by mouth daily., Disp: 90 tablet, Rfl: 0     Review of Systems  Per HPI unless specifically indicated above     Objective:    BP 119/78   Pulse 77   Temp 97.7 F (36.5 C)   Wt 182 lb (82.6 kg)   SpO2 98%   BMI 24.01 kg/m   Wt Readings from Last 3 Encounters:  11/07/23 182 lb (82.6 kg)  09/25/23 181 lb 12 oz (82.4 kg)  06/26/23 175 lb (79.4 kg)    Physical Exam Vitals reviewed.  Constitutional:      General: He is not in acute distress.    Appearance: He is well-developed. He is not toxic-appearing.  HENT:     Head: Normocephalic and atraumatic.     Right Ear: Tympanic membrane normal. There is impacted cerumen.     Left Ear: Tympanic membrane normal. There is impacted cerumen.     Ears:     Comments: TMs clear after lavage Cardiovascular:     Rate and  Rhythm: Normal rate and regular rhythm.  Pulmonary:     Effort: Pulmonary effort is normal.     Breath sounds: Normal breath sounds. No wheezing.  Musculoskeletal:     Cervical back: Neck supple.     Right lower leg: No edema.     Left lower leg: No edema.  Lymphadenopathy:     Cervical: No cervical adenopathy.  Skin:    General: Skin is warm and dry.  Neurological:     Mental Status: He is alert and oriented to person, place, and time.  Psychiatric:        Behavior: Behavior normal.            Assessment & Plan:    Encounter Diagnoses  Name Primary?   Primary hypertension Yes   Atherosclerosis of aorta (HCC)    Bilateral impacted cerumen    Pulmonary emphysema, unspecified emphysema type (HCC)    Tobacco use disorder    Osteoarthritis of right knee, unspecified osteoarthritis type      Htn -bp good today.  Continue lisinopril  Dyslipidemia -continue atorvastatin and lowfat diet  Smoking -encouraged cessation  OA knee -encouraged pt to contact orthopedics for appointment now that he will have insurance   Pt is encouraged to go ahead and call to get appointment sceduled with his new PCP

## 2023-11-21 ENCOUNTER — Other Ambulatory Visit: Payer: Medicare Other

## 2023-11-21 DIAGNOSIS — R972 Elevated prostate specific antigen [PSA]: Secondary | ICD-10-CM

## 2023-11-22 LAB — PSA, TOTAL AND FREE
PSA, Free Pct: 28.3 %
PSA, Free: 1.5 ng/mL
Prostate Specific Ag, Serum: 5.3 ng/mL — ABNORMAL HIGH (ref 0.0–4.0)

## 2023-12-06 ENCOUNTER — Ambulatory Visit: Payer: Medicare Other | Admitting: Urology

## 2023-12-06 VITALS — BP 141/80 | HR 97

## 2023-12-06 DIAGNOSIS — R351 Nocturia: Secondary | ICD-10-CM

## 2023-12-06 DIAGNOSIS — R972 Elevated prostate specific antigen [PSA]: Secondary | ICD-10-CM

## 2023-12-06 DIAGNOSIS — N138 Other obstructive and reflux uropathy: Secondary | ICD-10-CM

## 2023-12-06 DIAGNOSIS — N401 Enlarged prostate with lower urinary tract symptoms: Secondary | ICD-10-CM

## 2023-12-06 LAB — URINALYSIS, ROUTINE W REFLEX MICROSCOPIC
Bilirubin, UA: NEGATIVE
Glucose, UA: NEGATIVE
Ketones, UA: NEGATIVE
Leukocytes,UA: NEGATIVE
Nitrite, UA: NEGATIVE
Specific Gravity, UA: 1.025 (ref 1.005–1.030)
Urobilinogen, Ur: 2 mg/dL — ABNORMAL HIGH (ref 0.2–1.0)
pH, UA: 6 (ref 5.0–7.5)

## 2023-12-06 LAB — MICROSCOPIC EXAMINATION: Bacteria, UA: NONE SEEN

## 2023-12-06 NOTE — Progress Notes (Signed)
 12/06/2023 11:16 AM   Rodney Hampton [birth year] [id number]  Referring provider: Comer Kirsch, PA-C [address] [place],  [place] [address]  Followup Elevated PSA   HPI: Rodney Hampton is a 62yo here for followup for elevated PSA and BPH with Nocturia.  PSA stable at 5.3. IPSS 5 QOL 1 on no BPH therapy. Nocturia 1x.  Urine stream strong. No straining to urinate. No other complaints today.    PMH: Past Medical History:  Diagnosis Date   Elevated PSA    Hyperlipidemia    Hypertension    Tobacco use     Surgical History: Past Surgical History:  Procedure Laterality Date   MANDIBLE FRACTURE SURGERY      Home Medications:  Allergies as of 12/06/2023   No Known Allergies      Medication List        Accurate as of December 06, 2023 11:16 AM. If you have any questions, ask your nurse or doctor.          aspirin EC 81 MG tablet Take 81 mg by mouth daily. Swallow whole.   atorvastatin  20 MG tablet Commonly known as: LIPITOR Take 1 tablet (20 mg total) by mouth daily.   ibuprofen 200 MG tablet Commonly known as: ADVIL Take 200 mg by mouth every 6 (six) hours as needed.   lisinopril  10 MG tablet Commonly known as: ZESTRIL  Take 1 tablet (10 mg total) by mouth daily.        Allergies: No Known Allergies  Family History: Family History  Problem Relation Age of Onset   Cancer Mother    Leukemia Mother     Social History:  reports that he has been smoking cigarettes. He has never used smokeless tobacco. He reports that he does not currently use alcohol. He reports that he does not currently use drugs after having used the following drugs: Marijuana.  ROS: All other review of systems were reviewed and are negative except what is noted above in HPI  Physical Exam: BP (!) 141/80   Pulse 97   Constitutional:  Alert and oriented, No acute distress. HEENT: Rodney Hampton AT, moist mucus membranes.  Trachea midline, no masses. Cardiovascular: No clubbing, cyanosis, or  edema. Respiratory: Normal respiratory effort, no increased work of breathing. GI: Abdomen is soft, nontender, nondistended, no abdominal masses GU: No CVA tenderness.  Lymph: No cervical or inguinal lymphadenopathy. Skin: No rashes, bruises or suspicious lesions. Neurologic: Grossly intact, no focal deficits, moving all 4 extremities. Psychiatric: Normal mood and affect.  Laboratory Data: No results found for: WBC, HGB, HCT, MCV, PLT  Lab Results  Component Value Date   CREATININE 0.71 09/25/2023    No results found for: PSA  No results found for: TESTOSTERONE  No results found for: HGBA1C  Urinalysis    Component Value Date/Time   APPEARANCEUR Clear 08/12/2023 1309   GLUCOSEU Negative 08/12/2023 1309   BILIRUBINUR Negative 08/12/2023 1309   PROTEINUR Trace 08/12/2023 1309   NITRITE Negative 08/12/2023 1309   LEUKOCYTESUR Negative 08/12/2023 1309    Lab Results  Component Value Date   LABMICR See below: 08/12/2023   WBCUA 0-5 08/12/2023   LABEPIT 0-10 08/12/2023   MUCUS Present (A) 08/12/2023   BACTERIA None seen 08/12/2023    Pertinent Imaging:  No results found for this or any previous visit.  No results found for this or any previous visit.  No results found for this or any previous visit.  No results found for this or any  previous visit.  No results found for this or any previous visit.  No results found for this or any previous visit.  No results found for this or any previous visit.  No results found for this or any previous visit.   Assessment & Plan:    1. Elevated PSA (Primary) Followup 6 months with PSA - Urinalysis, Routine w reflex microscopic  2. Benign prostatic hyperplasia with urinary obstruction -patient defers therapy at this time  3. Nocturia Decrease fluid intake within 2 hours of going to bed.    No follow-ups on file.  Belvie Clara, MD  [facility] [place]

## 2023-12-15 ENCOUNTER — Encounter: Payer: Self-pay | Admitting: Urology

## 2023-12-15 NOTE — Patient Instructions (Signed)

## 2024-06-03 ENCOUNTER — Other Ambulatory Visit: Payer: Medicare Other

## 2024-06-03 DIAGNOSIS — R972 Elevated prostate specific antigen [PSA]: Secondary | ICD-10-CM

## 2024-06-04 LAB — PSA, TOTAL AND FREE
PSA, Free Pct: 29.1 %
PSA, Free: 1.31 ng/mL
Prostate Specific Ag, Serum: 4.5 ng/mL — ABNORMAL HIGH (ref 0.0–4.0)

## 2024-06-09 ENCOUNTER — Ambulatory Visit: Payer: Self-pay | Admitting: Urology

## 2024-06-10 ENCOUNTER — Ambulatory Visit: Payer: Medicare Other | Admitting: Urology

## 2024-06-10 ENCOUNTER — Encounter: Payer: Self-pay | Admitting: Urology

## 2024-06-10 VITALS — BP 133/81 | HR 89

## 2024-06-10 DIAGNOSIS — N138 Other obstructive and reflux uropathy: Secondary | ICD-10-CM | POA: Diagnosis not present

## 2024-06-10 DIAGNOSIS — R972 Elevated prostate specific antigen [PSA]: Secondary | ICD-10-CM | POA: Diagnosis not present

## 2024-06-10 DIAGNOSIS — N401 Enlarged prostate with lower urinary tract symptoms: Secondary | ICD-10-CM

## 2024-06-10 DIAGNOSIS — R351 Nocturia: Secondary | ICD-10-CM

## 2024-06-10 NOTE — Patient Instructions (Signed)

## 2024-06-10 NOTE — Addendum Note (Signed)
 Addended by: Edgardo Petrenko L on: 06/10/2024 11:45 AM   Modules accepted: Orders

## 2024-06-10 NOTE — Progress Notes (Signed)
 06/10/2024 11:38 AM   Rodney Hampton 03-28-1961 [id number]  Referring provider: Comer Kirsch, PA-C [address] [place],  [place] [address]  [place] elevated PSA   HPI: Rodney Hampton is a 63yo here for followup for BPh with nocturia and elevated PSA. PSA decreased to 4.5 from 5.3. IPSS 2 QOL 2. His dribbling has improved since last visit. Urinary urgency has improved since last visit. Nocturia 0-1x depending on fluid consumption.  No other complaints today   PMH: Past Medical History:  Diagnosis Date   Elevated PSA    Hyperlipidemia    Hypertension    Tobacco use     Surgical History: Past Surgical History:  Procedure Laterality Date   MANDIBLE FRACTURE SURGERY      Home Medications:  Allergies as of 06/10/2024   No Known Allergies      Medication List        Accurate as of June 10, 2024 11:38 AM. If you have any questions, ask your nurse or doctor.          aspirin EC 81 MG tablet Take 81 mg by mouth daily. Swallow whole.   atorvastatin  20 MG tablet Commonly known as: LIPITOR Take 1 tablet (20 mg total) by mouth daily.   ibuprofen 200 MG tablet Commonly known as: ADVIL Take 200 mg by mouth every 6 (six) hours as needed.   lisinopril  10 MG tablet Commonly known as: ZESTRIL  Take 1 tablet (10 mg total) by mouth daily.        Allergies: No Known Allergies  Family History: Family History  Problem Relation Age of Onset   Cancer Mother    Leukemia Mother     Social History:  reports that he has been smoking cigarettes. He has never used smokeless tobacco. He reports that he does not currently use alcohol. He reports that he does not currently use drugs after having used the following drugs: Marijuana.  ROS: All other review of systems were reviewed and are negative except what is noted above in HPI  Physical Exam: BP 133/81   Pulse 89   Constitutional:  Alert and oriented, No acute distress. HEENT: [place] AT, moist mucus membranes.  Trachea  midline, no masses. Cardiovascular: No clubbing, cyanosis, or edema. Respiratory: Normal respiratory effort, no increased work of breathing. GI: Abdomen is soft, nontender, nondistended, no abdominal masses GU: No CVA tenderness.  Lymph: No cervical or inguinal lymphadenopathy. Skin: No rashes, bruises or suspicious lesions. Neurologic: Grossly intact, no focal deficits, moving all 4 extremities. Psychiatric: Normal mood and affect.  Laboratory Data: No results found for: WBC, HGB, HCT, MCV, PLT  Lab Results  Component Value Date   CREATININE 0.71 09/25/2023    No results found for: PSA  No results found for: TESTOSTERONE  No results found for: HGBA1C  Urinalysis    Component Value Date/Time   APPEARANCEUR Clear 12/06/2023 1114   GLUCOSEU Negative 12/06/2023 1114   BILIRUBINUR Negative 12/06/2023 1114   PROTEINUR 1+ (A) 12/06/2023 1114   NITRITE Negative 12/06/2023 1114   LEUKOCYTESUR Negative 12/06/2023 1114    Lab Results  Component Value Date   LABMICR See below: 12/06/2023   WBCUA 0-5 12/06/2023   LABEPIT 0-10 12/06/2023   MUCUS Present (A) 12/06/2023   BACTERIA None seen 12/06/2023    Pertinent Imaging:  No results found for this or any previous visit.  No results found for this or any previous visit.  No results found for this or any previous visit.  No results found for this or any previous visit.  No results found for this or any previous visit.  No results found for this or any previous visit.  No results found for this or any previous visit.  No results found for this or any previous visit.   Assessment & Plan:    1. Elevated PSA (Primary) -followup 1 year with PSA  2. Benign prostatic hyperplasia with urinary obstruction -patient defers therapy at this time  3. Nocturia Decrease fluid intake with 2 hours of going to bed   No follow-ups on file.  Belvie Clara, MD  [facility] [place]

## 2024-07-24 ENCOUNTER — Encounter: Payer: Self-pay | Admitting: Radiology

## 2024-10-05 ENCOUNTER — Encounter: Payer: Self-pay | Admitting: Radiology

## 2025-06-03 ENCOUNTER — Other Ambulatory Visit

## 2025-06-11 ENCOUNTER — Ambulatory Visit: Admitting: Urology
